# Patient Record
Sex: Female | Born: 1945 | Race: White | Hispanic: No | Marital: Married | Smoking: Never smoker
Health system: Southern US, Community
[De-identification: ages and names within clinical notes are randomized; demographics above are authoritative.]

## PROBLEM LIST (undated history)

## (undated) DIAGNOSIS — Z789 Other specified health status: Secondary | ICD-10-CM

## (undated) DIAGNOSIS — K219 Gastro-esophageal reflux disease without esophagitis: Secondary | ICD-10-CM

## (undated) DIAGNOSIS — M1990 Unspecified osteoarthritis, unspecified site: Secondary | ICD-10-CM

## (undated) DIAGNOSIS — J302 Other seasonal allergic rhinitis: Secondary | ICD-10-CM

---

## 2001-08-01 HISTORY — PX: ORIF ANKLE FRACTURE: SHX5408

## 2001-09-23 ENCOUNTER — Emergency Department (HOSPITAL_COMMUNITY): Admission: EM | Admit: 2001-09-23 | Discharge: 2001-09-23 | Payer: Self-pay | Admitting: Emergency Medicine

## 2001-09-23 ENCOUNTER — Encounter: Payer: Self-pay | Admitting: Emergency Medicine

## 2001-09-24 ENCOUNTER — Ambulatory Visit (HOSPITAL_BASED_OUTPATIENT_CLINIC_OR_DEPARTMENT_OTHER): Admission: RE | Admit: 2001-09-24 | Discharge: 2001-09-25 | Payer: Self-pay | Admitting: Orthopedic Surgery

## 2009-03-18 ENCOUNTER — Ambulatory Visit: Payer: Self-pay | Admitting: Women's Health

## 2009-03-18 ENCOUNTER — Encounter: Payer: Self-pay | Admitting: Obstetrics and Gynecology

## 2009-03-18 ENCOUNTER — Other Ambulatory Visit: Admission: RE | Admit: 2009-03-18 | Discharge: 2009-03-18 | Payer: Self-pay | Admitting: Obstetrics and Gynecology

## 2009-03-20 ENCOUNTER — Ambulatory Visit: Payer: Self-pay | Admitting: Obstetrics and Gynecology

## 2009-04-07 ENCOUNTER — Ambulatory Visit: Payer: Self-pay | Admitting: Obstetrics and Gynecology

## 2010-12-17 NOTE — Op Note (Signed)
[name]. [facility]  Patient:    [name], [name] Visit Number: [id number] MRN: [id number]          Service Type: DSU Location: [facility] Attending Physician:  Alinda Deem Dictated by:   Alinda Deem, M.D. Proc. Date: 09/24/01 Admit Date:  09/24/2001                             Operative Report  PREOPERATIVE DIAGNOSIS:  Right ankle bimalleolar fracture.  POSTOPERATIVE DIAGNOSIS:  Right ankle bimalleolar fracture.  OPERATION PERFORMED:  Open reduction internal fixation of right ankle using Depuy titanium screw set on the medial side, two cannulated lag screws on the lateral side, a six-hole one third tubular plate and two anterior to posterior lag screws.  SURGEON:  Alinda Deem, M.D.  ASSISTANT:  Dorthula Matas, P.A.-C.  ANESTHESIA:  General endotracheal.  ESTIMATED BLOOD LOSS:  Minimal.  FLUID REPLACEMENT:  800 cc of crystalloid.  DRAINS:  None.  TOURNIQUET TIME:  One hour and five minutes.  INDICATIONS FOR PROCEDURE:  The patient is a 65 year old woman who slipped and fell on the stairs at home yesterday which would have been September 23, 2001 and sustained a minimally displaced but bimalleolar fracture of her right ankle.  She was seen at the [facility] Emergency Department and placed in a splint with crutches, seen in the office on the morning of September 24, 2001.  She was n.p.o. and she was taken for surgical intervention today to stabilize her unstable right ankle fracture to decrease pain and increase function.  Risks and benefits of surgery were understood by the patient.  DESCRIPTION OF PROCEDURE:  The patient was identified by arm band and taken to the operating room at [facility] where the appropriate anesthetic monitors were attached and general endotracheal anesthesia induced with the patient in the supine position.  Tourniquet applied high to the right calf. Right lower  extremity was prepped and draped in the usual sterile fashion from the toes to the tourniquet.  Limb wrapped with an Esmarch bandage.  Tourniquet inflated to 300 mHg and we started laterally making a longitudinal incision starting at the tip of the lateral malleolus and going proximally for about 8 to 10 cm.  Small bleeders in the skin and subcutaneous tissue were identified and cauterized.  We dissected down to the fibula fracture in a longitudinal fashion with the dissecting scissors and pick ups being careful to avoid any significant branches of the superficial peroneal nerve or the sternal nerve. We immediately identified the fracture site and cleaned off the edges with a periosteal elevator and then reduced the oblique spiral fracture SE2 pattern with a lions jaw clamp.  We placed to anterior to posterior lag screws and applied a one third tubular 6-hole plate with there proximal bicortical screws and two distal cancellous screws obtaining good firm fixation. The syndesmosis appeared to be intact.  We then directed our attention to the medial side where a 4 to 5 cm incision longitudinal over the medial malleolus was made from about 1 to 2 cm distal to the tip and then proximal for about 3 to 4 cm to the fracture site.  Small bleeders in the skin and subcutaneous tissues were identified and cauterized.  The medial malleolus fracture was a large piece oblique and we carefully cleaned the edges once again with the elevator and the scissors.  Using the  hook we inspected the joint carefully and found no cartilage bits in the joint.  We then reduced the medial malleus fragment with a towel clip and fixed it with two 4 mm one third threaded cancellous lag screws going from proximal to distal.  C-arm images were taken confirming the anatomic reduction of the fracture which could no longer be seen on the x-rays because of the reduction.  At this point the tourniquet was let down.  Small bleeders  once again identified and cauterized.  The wounds were washed out with normal saline solution.  The subcutaneous tissues were closed with running 2-0 Vicryl suture.  The skin with running interlocking 3-0 nylon and dressing of Xeroform, 4 x 4 dressing sponges, Webril and an Ace wrap and a Cam walker boot applied.  The patient was awakened and taken to the recovery room without difficulty. Dictated by:   Alinda Deem, M.D. Attending Physician:  Alinda Deem DD:  09/24/01 TD:  09/24/01 Job: [id number]

## 2014-07-08 DIAGNOSIS — Z23 Encounter for immunization: Secondary | ICD-10-CM | POA: Diagnosis not present

## 2014-08-18 DIAGNOSIS — M5442 Lumbago with sciatica, left side: Secondary | ICD-10-CM | POA: Diagnosis not present

## 2014-08-19 DIAGNOSIS — M5442 Lumbago with sciatica, left side: Secondary | ICD-10-CM | POA: Diagnosis not present

## 2014-09-05 DIAGNOSIS — M5442 Lumbago with sciatica, left side: Secondary | ICD-10-CM | POA: Diagnosis not present

## 2014-09-05 DIAGNOSIS — M5136 Other intervertebral disc degeneration, lumbar region: Secondary | ICD-10-CM | POA: Diagnosis not present

## 2014-09-16 DIAGNOSIS — M5442 Lumbago with sciatica, left side: Secondary | ICD-10-CM | POA: Diagnosis not present

## 2014-09-18 DIAGNOSIS — M5442 Lumbago with sciatica, left side: Secondary | ICD-10-CM | POA: Diagnosis not present

## 2014-09-22 DIAGNOSIS — M5442 Lumbago with sciatica, left side: Secondary | ICD-10-CM | POA: Diagnosis not present

## 2014-09-26 DIAGNOSIS — M5442 Lumbago with sciatica, left side: Secondary | ICD-10-CM | POA: Diagnosis not present

## 2014-09-29 DIAGNOSIS — M5442 Lumbago with sciatica, left side: Secondary | ICD-10-CM | POA: Diagnosis not present

## 2014-10-02 DIAGNOSIS — M5442 Lumbago with sciatica, left side: Secondary | ICD-10-CM | POA: Diagnosis not present

## 2014-10-06 DIAGNOSIS — M5442 Lumbago with sciatica, left side: Secondary | ICD-10-CM | POA: Diagnosis not present

## 2014-10-17 DIAGNOSIS — M5136 Other intervertebral disc degeneration, lumbar region: Secondary | ICD-10-CM | POA: Diagnosis not present

## 2014-10-17 DIAGNOSIS — M5442 Lumbago with sciatica, left side: Secondary | ICD-10-CM | POA: Diagnosis not present

## 2015-05-08 DIAGNOSIS — M5136 Other intervertebral disc degeneration, lumbar region: Secondary | ICD-10-CM | POA: Diagnosis not present

## 2015-05-08 DIAGNOSIS — M5442 Lumbago with sciatica, left side: Secondary | ICD-10-CM | POA: Diagnosis not present

## 2015-05-13 ENCOUNTER — Telehealth: Payer: Self-pay

## 2015-05-13 NOTE — Telephone Encounter (Signed)
Daughter advised.

## 2015-05-13 NOTE — Telephone Encounter (Signed)
She needs to have primary care okay her for surgery, this information can be conveyed to the daughter since she did call. Tell her we wish her luck with her surgery.

## 2015-05-13 NOTE — Telephone Encounter (Signed)
Daughter called stating mom to have some orthopedic back surgery and there are some questions her MD has.  Upon calling her back she said Orthopedic MD wants her to have exam to have someone listen to heart and lungs and check her out and make sure okay for surgery. Daughter was talking about scheduling appt with you.  Usually PCP is the one who gives medical clearance for surgery.  Would you not prefer she see PCP?   (We do not have DPR acess to speak with daughter about mom.)

## 2015-05-16 DIAGNOSIS — M545 Low back pain: Secondary | ICD-10-CM | POA: Diagnosis not present

## 2015-05-18 DIAGNOSIS — Z23 Encounter for immunization: Secondary | ICD-10-CM | POA: Diagnosis not present

## 2015-05-22 DIAGNOSIS — S32010A Wedge compression fracture of first lumbar vertebra, initial encounter for closed fracture: Secondary | ICD-10-CM | POA: Diagnosis not present

## 2015-05-22 DIAGNOSIS — M5136 Other intervertebral disc degeneration, lumbar region: Secondary | ICD-10-CM | POA: Diagnosis not present

## 2015-05-22 DIAGNOSIS — M5442 Lumbago with sciatica, left side: Secondary | ICD-10-CM | POA: Diagnosis not present

## 2015-06-02 ENCOUNTER — Other Ambulatory Visit (INDEPENDENT_AMBULATORY_CARE_PROVIDER_SITE_OTHER): Payer: BLUE CROSS/BLUE SHIELD

## 2015-06-02 ENCOUNTER — Encounter: Payer: Self-pay | Admitting: Internal Medicine

## 2015-06-02 ENCOUNTER — Ambulatory Visit (INDEPENDENT_AMBULATORY_CARE_PROVIDER_SITE_OTHER): Payer: BLUE CROSS/BLUE SHIELD | Admitting: Internal Medicine

## 2015-06-02 VITALS — BP 124/82 | HR 57 | Temp 98.3°F | Resp 16 | Ht 67.5 in | Wt 211.0 lb

## 2015-06-02 DIAGNOSIS — J302 Other seasonal allergic rhinitis: Secondary | ICD-10-CM

## 2015-06-02 DIAGNOSIS — Z01818 Encounter for other preprocedural examination: Secondary | ICD-10-CM

## 2015-06-02 DIAGNOSIS — E669 Obesity, unspecified: Secondary | ICD-10-CM

## 2015-06-02 DIAGNOSIS — J309 Allergic rhinitis, unspecified: Secondary | ICD-10-CM | POA: Insufficient documentation

## 2015-06-02 DIAGNOSIS — Z23 Encounter for immunization: Secondary | ICD-10-CM | POA: Diagnosis not present

## 2015-06-02 DIAGNOSIS — M5416 Radiculopathy, lumbar region: Secondary | ICD-10-CM | POA: Insufficient documentation

## 2015-06-02 LAB — COMPREHENSIVE METABOLIC PANEL
ALT: 16 U/L (ref 0–35)
AST: 26 U/L (ref 0–37)
Albumin: 3.9 g/dL (ref 3.5–5.2)
Alkaline Phosphatase: 88 U/L (ref 39–117)
BUN: 13 mg/dL (ref 6–23)
CO2: 30 mEq/L (ref 19–32)
Calcium: 9.6 mg/dL (ref 8.4–10.5)
Chloride: 106 mEq/L (ref 96–112)
Creatinine, Ser: 0.78 mg/dL (ref 0.40–1.20)
GFR: 78.01 mL/min (ref >60.00)
Glucose, Bld: 104 mg/dL — ABNORMAL HIGH (ref 70–99)
Potassium: 4.5 mEq/L (ref 3.5–5.1)
Sodium: 141 mEq/L (ref 135–145)
Total Bilirubin: 0.4 mg/dL (ref 0.2–1.2)
Total Protein: 7.8 g/dL (ref 6.0–8.3)

## 2015-06-02 LAB — CBC WITH DIFFERENTIAL/PLATELET
Basophils Absolute: 0 10*3/uL (ref 0.0–0.1)
Basophils Relative: 0.5 % (ref 0.0–3.0)
Eosinophils Absolute: 0.1 10*3/uL (ref 0.0–0.7)
Eosinophils Relative: 0.7 % (ref 0.0–5.0)
HCT: 42.4 % (ref 36.0–46.0)
Hemoglobin: 14 g/dL (ref 12.0–15.0)
Lymphocytes Relative: 37.4 % (ref 12.0–46.0)
Lymphs Abs: 2.7 10*3/uL (ref 0.7–4.0)
MCHC: 33 g/dL (ref 30.0–36.0)
MCV: 87.1 fl (ref 78.0–100.0)
Monocytes Absolute: 0.4 10*3/uL (ref 0.1–1.0)
Monocytes Relative: 5.8 % (ref 3.0–12.0)
Neutro Abs: 4 10*3/uL (ref 1.4–7.7)
Neutrophils Relative %: 55.6 % (ref 43.0–77.0)
Platelets: 198 10*3/uL (ref 150.0–400.0)
RBC: 4.87 Mil/uL (ref 3.87–5.11)
RDW: 15.2 % (ref 11.5–15.5)
WBC: 7.3 10*3/uL (ref 4.0–10.5)

## 2015-06-02 LAB — TSH: TSH: 1.21 u[IU]/mL (ref 0.35–4.50)

## 2015-06-02 LAB — HEMOGLOBIN A1C: HEMOGLOBIN A1C: 5.5 % (ref 4.6–6.5)

## 2015-06-02 NOTE — Progress Notes (Signed)
Subjective:    Patient ID: [name], female    DOB: 12/30/1945, 69 y.o.   MRN: [id number]  HPI She is here to establish with a new pcp. She needs clearance of surgery.  [facility] will be doing kyphoplasty on L1 on 07/02/15.  He has requested medical clearance.  She has not had a primary care physician for years.    Chronic lower back pain with radiculopathy:  She has a deteriorated discs in her lower back with arthritic changes that is causing radiating nerve pain and numbness down her left leg.  Her toes are numb on the left foot.  Her symptoms started a couple of years ago and have been getting worse. She has some weakness in her left leg.  Her symptoms limit how much she is able to walk.       Thoracic compression fracture:  She was found to have a compression fracture in her thoracic region.  She believes it was secondary to a fall last June.  The only other fracture she has had was her right ankle in 2003, which was from a fall.     She denies a personal or family history of problems with anesthesia.  She denies a family and personal history of bleeding or blood clot problems.   She is limited in her exercise, but denies any exertion chest pain, sob, palpitations or lightheadedness.   Medications and allergies reviewed with patient and updated if appropriate.  Patient Active Problem List   Diagnosis Date Noted  . Allergic rhinitis 06/02/2015    History reviewed. No pertinent past medical history.  Past Surgical History  Procedure Laterality Date  . Orif ankle fracture Right 2003    Social History   Social History  . Marital Status: Married    Spouse Name: N/A  . Number of Children: N/A  . Years of Education: N/A   Social History Main Topics  . Smoking status: Never Smoker   . Smokeless tobacco: Never Used  . Alcohol Use: No  . Drug Use: No  . Sexual Activity: Not Asked   Other Topics Concern  . None   Social History Narrative    . None    Review of Systems  Constitutional: Negative for fever, chills, appetite change, fatigue and unexpected weight change.  HENT: Positive for rhinorrhea. Negative for congestion, ear pain, hearing loss and sore throat.   Eyes: Negative for visual disturbance.  Respiratory: Positive for cough (intermittent with allergies). Negative for shortness of breath and wheezing.   Cardiovascular: Negative.   Gastrointestinal: Negative for nausea, abdominal pain, diarrhea, constipation and blood in stool.       GERD occasion with certain foods  Genitourinary: Negative for dysuria, frequency and hematuria.  Musculoskeletal: Positive for back pain and arthralgias (arthritis in hands).  Neurological: Positive for weakness (left leg) and numbness (numbness in left leg). Negative for dizziness, light-headedness and headaches.  Psychiatric/Behavioral: Negative for dysphoric mood. The patient is not nervous/anxious.        Objective:   Filed Vitals:   06/02/15 1506  BP: 124/82  Pulse: 57  Temp: 98.3 F (36.8 C)  Resp: 16   Filed Weights   06/02/15 1506  Weight: 211 lb (95.709 kg)   Body mass index is 32.54 kg/(m^2).   Physical Exam  Constitutional: She is oriented to person, place, and time. She appears well-developed and well-nourished. No distress.  HENT:  Head: Normocephalic and atraumatic.  Right Ear:  External ear normal.  Left Ear: External ear normal.  Mouth/Throat: Oropharynx is clear and moist.  Eyes: Conjunctivae and EOM are normal.  Neck: Neck supple. No JVD present. No tracheal deviation present. No thyromegaly present.  No carotid bruit  Cardiovascular: Normal rate, regular rhythm, normal heart sounds and intact distal pulses.   No murmur heard. Pulmonary/Chest: Effort normal and breath sounds normal. No respiratory distress. She has no wheezes.  Abdominal: Soft. She exhibits no distension. There is no tenderness.  Musculoskeletal: She exhibits no edema.   Lymphadenopathy:    She has no cervical adenopathy.  Neurological: She is alert and oriented to person, place, and time.  Skin: Skin is warm and dry. No rash noted.  Psychiatric: She has a normal mood and affect. Her behavior is normal.        Assessment & Plan:    Pre-operative clearance for 12/1 L1 kyphoplasty Since she has not had blood work done in several years or an EKG we will go ahead and do basic blood work and EKG today Asymptomatic cardiac pulmonary standpoint personal or family history of anesthesia difficulties or bleeding/blood clot problems Clearance for surgery and we will send a letter to orthopedic - Dr. Shon Baton.  Advised annual follow up for a PE

## 2015-06-02 NOTE — Progress Notes (Signed)
Pre visit review using our clinic review tool, if applicable. No additional management support is needed unless otherwise documented below in the visit note. 

## 2015-06-02 NOTE — Patient Instructions (Signed)
  You received a prevnar pneumonia vaccine and had an EKG today.  Test(s) ordered today. Your results will be released to MyChart (or called to you) after review, usually within 72hours after test completion. If any changes need to be made, you will be notified at that same time.  All other Health Maintenance issues reviewed.   I recommend an annual physical.   Medications reviewed and updated.  No changes recommended at this time.  We will send the clearance letter to your surgeon.

## 2015-06-04 ENCOUNTER — Telehealth: Payer: Self-pay | Admitting: Emergency Medicine

## 2015-06-04 NOTE — Telephone Encounter (Signed)
Pre op clearance has been sent to [facility].

## 2015-07-06 ENCOUNTER — Encounter (HOSPITAL_COMMUNITY): Payer: Self-pay

## 2015-07-06 ENCOUNTER — Encounter (HOSPITAL_COMMUNITY)
Admission: RE | Admit: 2015-07-06 | Discharge: 2015-07-06 | Disposition: A | Discharge status: Home / Self Care | Payer: BLUE CROSS/BLUE SHIELD | Source: Ambulatory Visit | Attending: Orthopedic Surgery | Admitting: Orthopedic Surgery

## 2015-07-06 DIAGNOSIS — M8008XA Age-related osteoporosis with current pathological fracture, vertebra(e), initial encounter for fracture: Principal | ICD-10-CM | POA: Diagnosis present

## 2015-07-06 DIAGNOSIS — M4807 Spinal stenosis, lumbosacral region: Secondary | ICD-10-CM | POA: Diagnosis present

## 2015-07-06 DIAGNOSIS — K219 Gastro-esophageal reflux disease without esophagitis: Secondary | ICD-10-CM | POA: Diagnosis present

## 2015-07-06 HISTORY — DX: Other specified health status: Z78.9

## 2015-07-06 HISTORY — DX: Gastro-esophageal reflux disease without esophagitis: K21.9

## 2015-07-06 HISTORY — DX: Unspecified osteoarthritis, unspecified site: M19.90

## 2015-07-06 HISTORY — DX: Other seasonal allergic rhinitis: J30.2

## 2015-07-06 LAB — TYPE AND SCREEN
ABO/RH(D): A POS
Antibody Screen: NEGATIVE

## 2015-07-06 LAB — CBC
HEMATOCRIT: 41.3 % (ref 36.0–46.0)
HEMOGLOBIN: 13.9 g/dL (ref 12.0–15.0)
MCH: 29.1 pg (ref 26.0–34.0)
MCHC: 33.7 g/dL (ref 30.0–36.0)
MCV: 86.6 fL (ref 78.0–100.0)
Platelets: 179 10*3/uL (ref 150–400)
RBC: 4.77 MIL/uL (ref 3.87–5.11)
RDW: 15 % (ref 11.5–15.5)
WBC: 6.2 10*3/uL (ref 4.0–10.5)

## 2015-07-06 LAB — SURGICAL PCR SCREEN
MRSA, PCR: NEGATIVE
STAPHYLOCOCCUS AUREUS: POSITIVE — AB

## 2015-07-06 LAB — ABO/RH: ABO/RH(D): A POS

## 2015-07-06 NOTE — Pre-Procedure Instructions (Signed)
    Janit Paganeggy J Traeger  07/06/2015     Your procedure is scheduled on Wednesday, December 7.  Report to [facility] Admitting at 11:00 A.M.              Your surgery or procedure is scheduled for 1: 00 P.M.   Call this number if you have problems the morning of surgery:[phone]   Remember:  Do not eat food or drink liquids after midnight Tuesday, December 6.  Take these medicines the morning of surgery with A SIP OF WATER None.                                                                                                                                                                                                                                                                                                                                            Stop taking Bioflex and Ibuprofen,.   Do not wear jewelry, make-up or nail polish.   Do not wear lotions, powders, or perfumes.   Do not shave 48 hours prior to surgery.    Do not bring valuables to the hospital.   [facility] is not responsible for any belongings or valuables.  Contacts, dentures or bridgework may not be worn into surgery.  Leave your suitcase in the car.  After surgery it may be brought to your room.  For patients admitted to the hospital, discharge time will be determined by your treatment team.  Patients discharged the day of surgery will not be allowed to drive home.   Name and phone number of your driver: -  Special instructions:  Review  [place] - Preparing For Surgery Please read over the following fact sheets that you were given.  Pain Booklet, Coughing and Deep Breathing, Blood Transfusion Information and Surgical Site Infection Prevention

## 2015-07-07 MED ORDER — CEFAZOLIN SODIUM-DEXTROSE 2-3 GM-% IV SOLR
2.0000 g | INTRAVENOUS | Status: AC
  Administered 2015-07-08 (×2): 2 g via INTRAVENOUS
  Filled 2015-07-07: qty 50

## 2015-07-07 NOTE — Anesthesia Preprocedure Evaluation (Addendum)
Anesthesia Evaluation  Patient identified by MRN, date of birth, ID band Patient awake    Reviewed: Allergy & Precautions, NPO status , Patient's Chart, lab work & pertinent test results  Airway Mallampati: II   Neck ROM: Full    Dental  (+) Edentulous Upper, Edentulous Lower   Pulmonary neg pulmonary ROS,    breath sounds clear to auscultation       Cardiovascular negative cardio ROS   Rhythm:Regular  EKG 06/2015 WNL, has had surgery clearance by PCP   Neuro/Psych    GI/Hepatic Neg liver ROS, GERD  Medicated,  Endo/Other  negative endocrine ROS  Renal/GU negative Renal ROS     Musculoskeletal  (+) Arthritis ,   Abdominal (+)  Abdomen: soft.    Peds  Hematology 14/41   Anesthesia Other Findings   Reproductive/Obstetrics                           Anesthesia Physical Anesthesia Plan  ASA: II  Anesthesia Plan: General   Post-op Pain Management:    Induction: Intravenous  Airway Management Planned: Oral ETT  Additional Equipment:   Intra-op Plan:   Post-operative Plan: Extubation in OR  Informed Consent: I have reviewed the patients History and Physical, chart, labs and discussed the procedure including the risks, benefits and alternatives for the proposed anesthesia with the patient or authorized representative who has indicated his/her understanding and acceptance.     Plan Discussed with:   Anesthesia Plan Comments: (2nd IV after turning)       Anesthesia Quick Evaluation

## 2015-07-08 ENCOUNTER — Inpatient Hospital Stay (HOSPITAL_COMMUNITY): Payer: BLUE CROSS/BLUE SHIELD | Admitting: Anesthesiology

## 2015-07-08 ENCOUNTER — Inpatient Hospital Stay (HOSPITAL_COMMUNITY)
Admission: RE | Admit: 2015-07-08 | Discharge: 2015-07-10 | DRG: 458 | Disposition: A | Discharge status: Home / Self Care | Payer: BLUE CROSS/BLUE SHIELD | Source: Ambulatory Visit | Attending: Orthopedic Surgery | Admitting: Orthopedic Surgery

## 2015-07-08 ENCOUNTER — Inpatient Hospital Stay (HOSPITAL_COMMUNITY): Payer: BLUE CROSS/BLUE SHIELD

## 2015-07-08 ENCOUNTER — Encounter (HOSPITAL_COMMUNITY)
Admission: RE | Disposition: A | Discharge status: Home / Self Care | Payer: BLUE CROSS/BLUE SHIELD | Source: Ambulatory Visit | Attending: Orthopedic Surgery

## 2015-07-08 ENCOUNTER — Encounter (HOSPITAL_COMMUNITY): Payer: Self-pay | Admitting: *Deleted

## 2015-07-08 DIAGNOSIS — S32010A Wedge compression fracture of first lumbar vertebra, initial encounter for closed fracture: Secondary | ICD-10-CM | POA: Diagnosis not present

## 2015-07-08 DIAGNOSIS — M5136 Other intervertebral disc degeneration, lumbar region: Secondary | ICD-10-CM | POA: Diagnosis not present

## 2015-07-08 DIAGNOSIS — M549 Dorsalgia, unspecified: Secondary | ICD-10-CM | POA: Diagnosis not present

## 2015-07-08 DIAGNOSIS — M4806 Spinal stenosis, lumbar region: Secondary | ICD-10-CM | POA: Diagnosis not present

## 2015-07-08 DIAGNOSIS — M5416 Radiculopathy, lumbar region: Secondary | ICD-10-CM | POA: Diagnosis not present

## 2015-07-08 DIAGNOSIS — Z419 Encounter for procedure for purposes other than remedying health state, unspecified: Secondary | ICD-10-CM

## 2015-07-08 DIAGNOSIS — Z9889 Other specified postprocedural states: Secondary | ICD-10-CM

## 2015-07-08 DIAGNOSIS — K219 Gastro-esophageal reflux disease without esophagitis: Secondary | ICD-10-CM | POA: Diagnosis not present

## 2015-07-08 DIAGNOSIS — M4807 Spinal stenosis, lumbosacral region: Secondary | ICD-10-CM | POA: Diagnosis present

## 2015-07-08 DIAGNOSIS — M8008XA Age-related osteoporosis with current pathological fracture, vertebra(e), initial encounter for fracture: Secondary | ICD-10-CM | POA: Diagnosis present

## 2015-07-08 DIAGNOSIS — G8929 Other chronic pain: Secondary | ICD-10-CM | POA: Diagnosis present

## 2015-07-08 HISTORY — PX: KYPHOPLASTY: SHX5884

## 2015-07-08 HISTORY — PX: TRANSFORAMINAL LUMBAR INTERBODY FUSION (TLIF) WITH PEDICLE SCREW FIXATION 1 LEVEL: SHX6141

## 2015-07-08 SURGERY — KYPHOPLASTY
Anesthesia: General | Site: Back

## 2015-07-08 MED ORDER — PROPOFOL 10 MG/ML IV BOLUS
INTRAVENOUS | Status: DC | PRN
  Administered 2015-07-08: 150 mg via INTRAVENOUS

## 2015-07-08 MED ORDER — METHOCARBAMOL 500 MG PO TABS
500.0000 mg | ORAL_TABLET | Freq: Four times a day (QID) | ORAL | Status: DC | PRN
  Administered 2015-07-08 – 2015-07-10 (×5): 500 mg via ORAL
  Filled 2015-07-08 (×5): qty 1

## 2015-07-08 MED ORDER — BUPIVACAINE-EPINEPHRINE (PF) 0.25% -1:200000 IJ SOLN
INTRAMUSCULAR | Status: AC
  Filled 2015-07-08: qty 30

## 2015-07-08 MED ORDER — PROPOFOL 10 MG/ML IV BOLUS
INTRAVENOUS | Status: AC
  Filled 2015-07-08: qty 20

## 2015-07-08 MED ORDER — CEFAZOLIN SODIUM-DEXTROSE 2-3 GM-% IV SOLR
INTRAVENOUS | Status: AC
  Filled 2015-07-08: qty 50

## 2015-07-08 MED ORDER — MENTHOL 3 MG MT LOZG: 1.0000 | LOZENGE | OROMUCOSAL | Status: DC | PRN

## 2015-07-08 MED ORDER — DEXAMETHASONE SODIUM PHOSPHATE 4 MG/ML IJ SOLN
4.0000 mg | Freq: Four times a day (QID) | INTRAMUSCULAR | Status: AC
  Administered 2015-07-08 – 2015-07-09 (×3): 4 mg via INTRAVENOUS
  Filled 2015-07-08 (×3): qty 1

## 2015-07-08 MED ORDER — OXYCODONE-ACETAMINOPHEN 10-325 MG PO TABS: 1.0000 | ORAL_TABLET | ORAL | Status: DC | PRN

## 2015-07-08 MED ORDER — LIDOCAINE HCL (CARDIAC) 20 MG/ML IV SOLN
INTRAVENOUS | Status: AC
  Filled 2015-07-08: qty 5

## 2015-07-08 MED ORDER — ONDANSETRON HCL 4 MG/2ML IJ SOLN
INTRAMUSCULAR | Status: AC
  Filled 2015-07-08: qty 2

## 2015-07-08 MED ORDER — EPHEDRINE SULFATE 50 MG/ML IJ SOLN
INTRAMUSCULAR | Status: AC
  Filled 2015-07-08: qty 1

## 2015-07-08 MED ORDER — THROMBIN 20000 UNITS EX SOLR
CUTANEOUS | Status: AC
  Filled 2015-07-08: qty 20000

## 2015-07-08 MED ORDER — HYDROMORPHONE HCL 1 MG/ML IJ SOLN
INTRAMUSCULAR | Status: AC
  Administered 2015-07-08: 0.5 mg via INTRAVENOUS
  Filled 2015-07-08: qty 1

## 2015-07-08 MED ORDER — PROPOFOL 500 MG/50ML IV EMUL
INTRAVENOUS | Status: DC | PRN
  Administered 2015-07-08: 50 ug/kg/min via INTRAVENOUS

## 2015-07-08 MED ORDER — BUPIVACAINE-EPINEPHRINE 0.25% -1:200000 IJ SOLN
INTRAMUSCULAR | Status: DC | PRN
  Administered 2015-07-08: 20 mL

## 2015-07-08 MED ORDER — FENTANYL CITRATE (PF) 250 MCG/5ML IJ SOLN
INTRAMUSCULAR | Status: DC | PRN
  Administered 2015-07-08: 25 ug via INTRAVENOUS
  Administered 2015-07-08: 100 ug via INTRAVENOUS
  Administered 2015-07-08: 50 ug via INTRAVENOUS
  Administered 2015-07-08: 100 ug via INTRAVENOUS
  Administered 2015-07-08: 150 ug via INTRAVENOUS

## 2015-07-08 MED ORDER — MIDAZOLAM HCL 2 MG/2ML IJ SOLN
INTRAMUSCULAR | Status: AC
  Filled 2015-07-08: qty 2

## 2015-07-08 MED ORDER — LIDOCAINE HCL (CARDIAC) 20 MG/ML IV SOLN
INTRAVENOUS | Status: DC | PRN
  Administered 2015-07-08: 100 mg via INTRAVENOUS

## 2015-07-08 MED ORDER — FENTANYL CITRATE (PF) 250 MCG/5ML IJ SOLN
INTRAMUSCULAR | Status: AC
  Filled 2015-07-08: qty 5

## 2015-07-08 MED ORDER — SUCCINYLCHOLINE CHLORIDE 20 MG/ML IJ SOLN
INTRAMUSCULAR | Status: DC | PRN
  Administered 2015-07-08: 100 mg via INTRAVENOUS

## 2015-07-08 MED ORDER — LACTATED RINGERS IV SOLN
INTRAVENOUS | Status: DC
  Administered 2015-07-08: 20:00:00 via INTRAVENOUS

## 2015-07-08 MED ORDER — SODIUM CHLORIDE 0.9 % IJ SOLN: 3.0000 mL | INTRAMUSCULAR | Status: DC | PRN

## 2015-07-08 MED ORDER — ACETAMINOPHEN 10 MG/ML IV SOLN
INTRAVENOUS | Status: DC | PRN
  Administered 2015-07-08: 1000 mg via INTRAVENOUS

## 2015-07-08 MED ORDER — SUCCINYLCHOLINE CHLORIDE 20 MG/ML IJ SOLN
INTRAMUSCULAR | Status: AC
  Filled 2015-07-08: qty 1

## 2015-07-08 MED ORDER — SODIUM CHLORIDE 0.9 % IV SOLN
250.0000 mL | INTRAVENOUS | Status: DC
  Administered 2015-07-08: 250 mL via INTRAVENOUS

## 2015-07-08 MED ORDER — EPHEDRINE SULFATE 50 MG/ML IJ SOLN
INTRAMUSCULAR | Status: DC | PRN
  Administered 2015-07-08: 10 mg via INTRAVENOUS

## 2015-07-08 MED ORDER — 0.9 % SODIUM CHLORIDE (POUR BTL) OPTIME
TOPICAL | Status: DC | PRN
  Administered 2015-07-08: 1000 mL

## 2015-07-08 MED ORDER — THROMBIN 20000 UNITS EX KIT
PACK | CUTANEOUS | Status: DC | PRN
  Administered 2015-07-08: 20 mL via TOPICAL

## 2015-07-08 MED ORDER — GLYCOPYRROLATE 0.2 MG/ML IJ SOLN
INTRAMUSCULAR | Status: AC
  Filled 2015-07-08: qty 1

## 2015-07-08 MED ORDER — SODIUM CHLORIDE 0.9 % IJ SOLN
3.0000 mL | Freq: Two times a day (BID) | INTRAMUSCULAR | Status: DC
  Administered 2015-07-09 – 2015-07-10 (×2): 3 mL via INTRAVENOUS

## 2015-07-08 MED ORDER — MEPERIDINE HCL 25 MG/ML IJ SOLN: 6.2500 mg | INTRAMUSCULAR | Status: DC | PRN

## 2015-07-08 MED ORDER — PHENYLEPHRINE 40 MCG/ML (10ML) SYRINGE FOR IV PUSH (FOR BLOOD PRESSURE SUPPORT)
PREFILLED_SYRINGE | INTRAVENOUS | Status: AC
  Filled 2015-07-08: qty 10

## 2015-07-08 MED ORDER — MORPHINE SULFATE (PF) 2 MG/ML IV SOLN: 1.0000 mg | INTRAVENOUS | Status: DC | PRN

## 2015-07-08 MED ORDER — METHOCARBAMOL 1000 MG/10ML IJ SOLN: 500.0000 mg | Freq: Four times a day (QID) | INTRAVENOUS | Status: DC | PRN

## 2015-07-08 MED ORDER — LACTATED RINGERS IV SOLN
Freq: Once | INTRAVENOUS | Status: AC
  Administered 2015-07-08: 12:00:00 via INTRAVENOUS

## 2015-07-08 MED ORDER — ONDANSETRON HCL 4 MG/2ML IJ SOLN
INTRAMUSCULAR | Status: DC | PRN
Start: 2015-07-08 — End: 2015-07-08
  Administered 2015-07-08 (×2): 4 mg via INTRAVENOUS

## 2015-07-08 MED ORDER — PHENOL 1.4 % MT LIQD: 1.0000 | OROMUCOSAL | Status: DC | PRN

## 2015-07-08 MED ORDER — DEXAMETHASONE 4 MG PO TABS: 4.0000 mg | ORAL_TABLET | Freq: Four times a day (QID) | ORAL | Status: AC

## 2015-07-08 MED ORDER — OXYCODONE HCL 5 MG PO TABS
10.0000 mg | ORAL_TABLET | ORAL | Status: DC | PRN
  Administered 2015-07-08 – 2015-07-10 (×7): 10 mg via ORAL
  Filled 2015-07-08 (×7): qty 2

## 2015-07-08 MED ORDER — PHENYLEPHRINE HCL 10 MG/ML IJ SOLN
INTRAMUSCULAR | Status: DC | PRN
  Administered 2015-07-08: 120 ug via INTRAVENOUS

## 2015-07-08 MED ORDER — PROMETHAZINE HCL 25 MG/ML IJ SOLN: 6.2500 mg | INTRAMUSCULAR | Status: DC | PRN

## 2015-07-08 MED ORDER — DEXAMETHASONE SODIUM PHOSPHATE 4 MG/ML IJ SOLN
INTRAMUSCULAR | Status: DC | PRN
  Administered 2015-07-08: 8 mg via INTRAVENOUS

## 2015-07-08 MED ORDER — METHOCARBAMOL 500 MG PO TABS: 500.0000 mg | ORAL_TABLET | Freq: Three times a day (TID) | ORAL | Status: DC | PRN

## 2015-07-08 MED ORDER — ACETAMINOPHEN 10 MG/ML IV SOLN
INTRAVENOUS | Status: AC
  Filled 2015-07-08: qty 100

## 2015-07-08 MED ORDER — ONDANSETRON HCL 4 MG/2ML IJ SOLN: 4.0000 mg | INTRAMUSCULAR | Status: DC | PRN

## 2015-07-08 MED ORDER — PHENYLEPHRINE HCL 10 MG/ML IJ SOLN
10.0000 mg | INTRAMUSCULAR | Status: DC | PRN
  Administered 2015-07-08: 25 ug/min via INTRAVENOUS

## 2015-07-08 MED ORDER — FLEET ENEMA 7-19 GM/118ML RE ENEM
1.0000 | ENEMA | Freq: Once | RECTAL | Status: AC
  Administered 2015-07-08: 1 via RECTAL
  Filled 2015-07-08: qty 1

## 2015-07-08 MED ORDER — MAGNESIUM CITRATE PO SOLN
0.5000 | Freq: Once | ORAL | Status: AC
  Administered 2015-07-08: 0.5 via ORAL

## 2015-07-08 MED ORDER — HEMOSTATIC AGENTS (NO CHARGE) OPTIME
TOPICAL | Status: DC | PRN
  Administered 2015-07-08: 1 via TOPICAL

## 2015-07-08 MED ORDER — GLYCOPYRROLATE 0.2 MG/ML IJ SOLN
INTRAMUSCULAR | Status: DC | PRN
  Administered 2015-07-08 (×2): 0.2 mg via INTRAVENOUS

## 2015-07-08 MED ORDER — ONDANSETRON HCL 4 MG PO TABS: 4.0000 mg | ORAL_TABLET | Freq: Three times a day (TID) | ORAL | Status: DC | PRN

## 2015-07-08 MED ORDER — CEFAZOLIN SODIUM 1-5 GM-% IV SOLN
1.0000 g | Freq: Three times a day (TID) | INTRAVENOUS | Status: AC
  Administered 2015-07-09 (×2): 1 g via INTRAVENOUS
  Filled 2015-07-08 (×2): qty 50

## 2015-07-08 MED ORDER — MIDAZOLAM HCL 2 MG/2ML IJ SOLN
INTRAMUSCULAR | Status: DC | PRN
  Administered 2015-07-08: 2 mg via INTRAVENOUS

## 2015-07-08 MED ORDER — LACTATED RINGERS IV SOLN
INTRAVENOUS | Status: DC | PRN
  Administered 2015-07-08 (×4): via INTRAVENOUS

## 2015-07-08 MED ORDER — IOHEXOL 300 MG/ML  SOLN
INTRAMUSCULAR | Status: DC | PRN
  Administered 2015-07-08: 10 mL

## 2015-07-08 MED ORDER — HYDROMORPHONE HCL 1 MG/ML IJ SOLN
0.2500 mg | INTRAMUSCULAR | Status: DC | PRN
  Administered 2015-07-08 (×4): 0.5 mg via INTRAVENOUS

## 2015-07-08 MED ORDER — ARTIFICIAL TEARS OP OINT
TOPICAL_OINTMENT | OPHTHALMIC | Status: AC
  Filled 2015-07-08: qty 3.5

## 2015-07-08 MED ORDER — DEXAMETHASONE SODIUM PHOSPHATE 4 MG/ML IJ SOLN
INTRAMUSCULAR | Status: AC
  Filled 2015-07-08: qty 2

## 2015-07-08 SURGICAL SUPPLY — 68 items
BANDAGE ADH SHEER 1  50/CT (GAUZE/BANDAGES/DRESSINGS) IMPLANT
BLADE SURG 15 STRL LF DISP TIS (BLADE) ×1 IMPLANT
BLADE SURG 15 STRL SS (BLADE) ×2
BONE CEMENT SUPPLIES ×3 IMPLANT
BONE FILLER DEVICE STRL SZ3 (INSTRUMENTS) ×3 IMPLANT
CAGE TLIF XLRG 9 LUMBAR (Cage) ×2 IMPLANT
CAGE TLIF XLRG 9MM LUMBAR (Cage) ×1 IMPLANT
CEMENT BONE KYPHX HV R (Orthopedic Implant) ×3 IMPLANT
CEMENT KYPHON C01A KIT/MIXER (Cement) ×3 IMPLANT
CLIP NEUROVISION LG (CLIP) ×3 IMPLANT
CLOSURE STERI-STRIP 1/2X4 (GAUZE/BANDAGES/DRESSINGS) ×1
CLSR STERI-STRIP ANTIMIC 1/2X4 (GAUZE/BANDAGES/DRESSINGS) ×2 IMPLANT
COVER MAYO STAND STRL (DRAPES) ×3 IMPLANT
CURETTE WEDGE 8.5MM KYPHX (MISCELLANEOUS) IMPLANT
DRAPE C-ARM 42X72 X-RAY (DRAPES) ×6 IMPLANT
DRAPE INCISE IOBAN 66X45 STRL (DRAPES) ×3 IMPLANT
DRAPE LAPAROTOMY T 102X78X121 (DRAPES) ×3 IMPLANT
DRAPE PROXIMA HALF (DRAPES) ×6 IMPLANT
DRSG MEPILEX BORDER 4X8 (GAUZE/BANDAGES/DRESSINGS) ×3 IMPLANT
DURAPREP 26ML APPLICATOR (WOUND CARE) ×3 IMPLANT
ELECT PENCIL ROCKER SW 15FT (MISCELLANEOUS) ×3 IMPLANT
GAUZE SPONGE 4X4 16PLY XRAY LF (GAUZE/BANDAGES/DRESSINGS) ×3 IMPLANT
GLOVE BIOGEL PI IND STRL 8 (GLOVE) ×1 IMPLANT
GLOVE BIOGEL PI INDICATOR 8 (GLOVE) ×2
GLOVE ORTHO TXT STRL SZ7.5 (GLOVE) ×3 IMPLANT
GLOVE SS BIOGEL STRL SZ 8.5 (GLOVE) ×2 IMPLANT
GLOVE SUPERSENSE BIOGEL SZ 8.5 (GLOVE) ×4
GOWN STRL REUS W/ TWL LRG LVL3 (GOWN DISPOSABLE) ×1 IMPLANT
GOWN STRL REUS W/TWL 2XL LVL3 (GOWN DISPOSABLE) ×6 IMPLANT
GOWN STRL REUS W/TWL LRG LVL3 (GOWN DISPOSABLE) ×3
GUIDEWIRE NITINOL BEVEL TIP (WIRE) ×12 IMPLANT
KIT BASIN OR (CUSTOM PROCEDURE TRAY) ×3 IMPLANT
KIT NEEDLE NVM5 EMG ELECT (KITS) ×1 IMPLANT
KIT NEEDLE NVM5 EMG ELECTRODE (KITS) ×2
KIT ROOM TURNOVER OR (KITS) ×3 IMPLANT
LIGHT SOURCE ANGLE TIP STR 7FT (MISCELLANEOUS) ×3 IMPLANT
NEEDLE HYPO 25X1 1.5 SAFETY (NEEDLE) ×3 IMPLANT
NEEDLE I-PASS III (NEEDLE) ×3 IMPLANT
NEEDLE SPNL 18GX3.5 QUINCKE PK (NEEDLE) ×6 IMPLANT
NEEDLE SSEP/EMG (NEEDLE) ×3 IMPLANT
NS IRRIG 1000ML POUR BTL (IV SOLUTION) ×3 IMPLANT
NVM5 NEEDLE MODULE, SSEP ×2 IMPLANT
PACK SURGICAL SETUP 50X90 (CUSTOM PROCEDURE TRAY) ×3 IMPLANT
PACK UNIVERSAL I (CUSTOM PROCEDURE TRAY) ×3 IMPLANT
PAD ARMBOARD 7.5X6 YLW CONV (MISCELLANEOUS) ×6 IMPLANT
PROBE BALL TIP NVM5 SNG USE (BALLOONS) ×3 IMPLANT
PUTTY DBX 1CC (Putty) ×3 IMPLANT
PUTTY DBX 1CC DEPUY (Putty) ×1 IMPLANT
ROD RELINE MAS LORD 5.5X45MM (Rod) ×3 IMPLANT
ROD RELINE MAS LORD 5.5X50 (Rod) ×3 IMPLANT
SCREW RELINE RED 6.5X45MM POLY (Screw) ×3 IMPLANT
SCREW SHANK RELINE 6.5X45MM 2C (Screw) ×3 IMPLANT
SHEET CONFORM 45LX20WX5H (Bone Implant) ×3 IMPLANT
SURGIFLO W/THROMBIN 8M KIT (HEMOSTASIS) ×3 IMPLANT
SUT BONE WAX W31G (SUTURE) ×3 IMPLANT
SUT MON AB 3-0 SH 27 (SUTURE) ×6
SUT MON AB 3-0 SH27 (SUTURE) ×2 IMPLANT
SUT VIC AB 1 CT1 18XCR BRD 8 (SUTURE) ×2 IMPLANT
SUT VIC AB 1 CT1 8-18 (SUTURE) ×6
SUT VIC AB 2-0 CT1 18 (SUTURE) ×6 IMPLANT
SUT VIC AB 2-0 CT1 27 (SUTURE) ×3
SUT VIC AB 2-0 CT1 TAPERPNT 27 (SUTURE) ×1 IMPLANT
SYR CONTROL 10ML LL (SYRINGE) ×3 IMPLANT
TOWEL OR 17X24 6PK STRL BLUE (TOWEL DISPOSABLE) ×6 IMPLANT
TOWEL OR 17X26 10 PK STRL BLUE (TOWEL DISPOSABLE) ×3 IMPLANT
TRAY KYPHOPAK 15/3 ONESTEP 1ST (MISCELLANEOUS) ×3 IMPLANT
TRAY KYPHOPAK 20/3 ONESTEP 1ST (MISCELLANEOUS) IMPLANT
WATER STERILE IRR 1000ML POUR (IV SOLUTION) IMPLANT

## 2015-07-08 NOTE — Transfer of Care (Signed)
Immediate Anesthesia Transfer of Care Note  Patient: [name]  Procedure(s) Performed: Procedure(s): L1 KYPHOPLASTY (N/A) L5 - S1 TRANSFORAMINAL LUMBAR INTERBODY FUSION (TLIF) WITH PEDICLE SCREW FIXATION 1 LEVEL (N/A)  Patient Location: PACU  Anesthesia Type:General  Level of Consciousness: awake, alert , oriented and patient cooperative  Airway & Oxygen Therapy: Patient Spontanous Breathing and Patient connected to nasal cannula oxygen  Post-op Assessment: Report given to RN, Post -op Vital signs reviewed and stable and Patient moving all extremities X 4  Post vital signs: Reviewed and stable  Last Vitals:  Filed Vitals:   07/08/15 1059  BP: 148/76  Pulse: 63  Temp: 36.4 C    Complications: No apparent anesthesia complications

## 2015-07-08 NOTE — Brief Op Note (Signed)
07/08/2015  5:13 PM  PATIENT:  Wendy PaganPeggy J Saunders  69 y.o. 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  PROCEDURE:  Procedure(s): L1 KYPHOPLASTY (N/A) L5 - S1 TRANSFORAMINAL LUMBAR INTERBODY FUSION (TLIF) WITH PEDICLE SCREW FIXATION 1 LEVEL (N/A)  SURGEON:  Surgeon(s) and Role:    * Venita Lickahari Leshonda Galambos, MD - Primary  PHYSICIAN ASSISTANT:   ASSISTANTS: Carmen [name]   ANESTHESIA:   general  EBL:  Total I/O In: 2000 [I.V.:2000] Out: 480 [Urine:130; Blood:350]  BLOOD ADMINISTERED:none  DRAINS: none   LOCAL MEDICATIONS USED:  MARCAINE     SPECIMEN:  No Specimen  DISPOSITION OF SPECIMEN:  N/A  COUNTS:  YES  TOURNIQUET:  * No tourniquets in log *  DICTATION: .Other Dictation: Dictation Number [id number]  PLAN OF CARE: Admit to inpatient   PATIENT DISPOSITION:  PACU - hemodynamically stable.

## 2015-07-08 NOTE — H&P (Signed)
History of Present Illness  The patient is a 69 year old female who comes in today for a preoperative History and Physical. The patient is scheduled for a L1 Kyphoplasty , L5-S1 TLIF to be performed by Dr. Debria Garret D. Shon Baton, MD at [facility] on 07/08/15 . Please see the hospital record for complete dictated history and physical. Increased pain with ambulation. She has difficulty straightening up when she gets up from a chair.  Additional reasons for visit:  Follow-up back is described as the following: The patient is being followed for their left-sided back pain. Symptoms reported today include: pain (left leg), pain at night, aching, stiffness, pain with weightbearing, difficulty ambulating, difficulty arising from chair, weakness, numbness (left leg to the toes), burning, pain with lifting, pain with sitting and pain with standing, while the patient does not report symptoms of: swelling, throbbing, locking, catching, popping, grinding, giving way or instability. The patient states that they are doing poorly. The following medication has been used for pain control: antiinflammatory medication (advil prn). The patient reports their current pain level to be 4 / 10 ("just numbness"). The patient presents today following MRI (lumbar @ [facility] 05-16-15). Holding something in front of her like a book will increase pain. She denies leg pain. She reports numbness in her LLE.   Allergies No Known Drug Allergies 08/18/2014  Family History Diabetes Mellitus  mother and grandmother fathers side Hypertension  mother and grandmother mothers side Rheumatoid Arthritis  grandmother mothers side and grandmother fathers side  Social History  Tobacco use  never smoker Alcohol use  never consumed alcohol Drug/Alcohol Rehab (Previously)  no Children  3 Current work status  retired Financial planner (Currently)  no Number of flights of stairs before winded  greater than 5 Living situation  live  with spouse Illicit drug use  no Pain Contract  no Marital status  married  Medication History Advil (  Tablet, Oral) Active.  Vitals  06/29/2015 8:48 AM Weight: 213 lb Height: 67.75in Body Surface Area: 2.09 m Body Mass Index: 32.63 kg/m  Pulse: 58 (Regular)  BP: 146/103 (Sitting, Left Arm, Standard)  General General Appearance-Not in acute distress. Orientation-Oriented X3. Build & Nutrition-Well nourished and Well developed.  Integumentary General Characteristics Surgical Scars - no surgical scar evidence of previous lumbar surgery. Lumbar Spine-Skin examination of the lumbar spine is without deformity, skin lesions, lacerations or abrasions.  Chest and Lung Exam Auscultation Breath sounds - Normal and Clear.  Cardiovascular Auscultation Rhythm - Regular rate and rhythm.  Abdomen Palpation/Percussion Palpation and Percussion of the abdomen reveal - Soft, Non Tender and No Rebound tenderness.  Peripheral Vascular Lower Extremity Palpation - Posterior tibial pulse - Bilateral - 2+. Dorsalis pedis pulse - Bilateral - 2+.  Neurologic Sensation Lower Extremity - Left - sensation is diminished in the lower extremity. Right - sensation is intact in the lower extremity. Reflexes Patellar Reflex - Bilateral - 2+. Achilles Reflex - Bilateral - 2+. Clonus - Bilateral - clonus not present. Hoffman's Sign - Bilateral - Hoffman's sign not present. Testing Seated Straight Leg Raise - Bilateral - Seated straight leg raise negative.  Musculoskeletal Spine/Ribs/Pelvis  Lumbosacral Spine: Inspection and Palpation - Tenderness - localized and mid lower back tender to palpation. Strength and Tone: Strength - Hip Flexion - Bilateral - 5/5. Knee Extension - Bilateral - 5/5. Knee Flexion - Bilateral - 5/5. Ankle Dorsiflexion - Bilateral - 5/5. Ankle Plantarflexion - Bilateral - 5/5. Heel walk - Bilateral - unable to heel walk.  Toe Walk - Bilateral - able to  walk on toes with moderate difficulty. Heel-Toe Walk - Bilateral - able to heel-toe walk without difficulty. ROM - Flexion - moderately decreased range of motion and painful. Extension - moderately decreased range of motion and painful. Left Lateral Bending - moderately decreased range of motion and painful. Right Lateral Bending - moderately decreased range of motion and painful. Right Rotation - mildly decreased range of motion and painful. Left Rotation - mildly decreased range of motion and painful. Pain - . Lumbosacral Spine - Waddell's Signs - no Waddell's signs present. Lower Extremity Range of Motion - No true hip, knee or ankle pain with range of motion. Gait and Station - [employer] - no assistive devices.  Her new MRI from 05/15/2014 shows a new compression fracture of L1 that has developed since her previous MRI. There are no axial cuts through this, only the sagittal. 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 At this point in time after a long discussion, the patient has just as much back pain as she does leg pain.   This has gotten progressively worse despite physical therapy, activity modification, medication, injection. At this point, we have had a long talk about surgery. In order to address her problem, I have recommended an L1 kyphoplasty as well as TLIF of L5-S1. The TLIF will allow me direct visualization of the L5 and S1 nerve roots to ensure their adequate decompression and then stabilization after the extensive decompression that would be required. It will also allow me to address the discogenic back pain that she is currently suffering from. We reviewed the risks which include the cement leak, infection, bleeding, nerve damage, death, stroke, paralysis, other fractures, need for further surgery, ongoing or worse pain, loss of bowel and bladder control. All of her  questions were encouraged and addressed.   Goal Of Surgery:Discussed that goal of surgery is to reduce pain and improve function and quality of life. Patient is aware that despite all appropriate treatment that there pain and function could be the same, worse, or different. Posterior Lumbar Decompression/disectomy: Risks of surgery include infection, bleeding, nerve damage, death, stroke, paralysis, failure to heal, need for further surgery, ongoing or worse pain, need for further surgery, CSF leak, loss of bowel or bladder, and recurrent disc herniation or Stenosis which would necessitate need for further surgery.

## 2015-07-08 NOTE — Anesthesia Procedure Notes (Signed)
Procedure Name: Intubation Date/Time: 07/08/2015 1:19 PM Performed by: Salomon MastWALL, Alvia Jablonski COREY Pre-anesthesia Checklist: Patient identified, Emergency Drugs available, Suction available and Patient being monitored Patient Re-evaluated:Patient Re-evaluated prior to inductionOxygen Delivery Method: Circle system utilized Preoxygenation: Pre-oxygenation with 100% oxygen Intubation Type: IV induction Ventilation: Mask ventilation without difficulty Laryngoscope Size: Mac and 3 Grade View: Grade I Tube type: Oral Tube size: 7.0 mm Number of attempts: 1 Airway Equipment and Method: Stylet Placement Confirmation: ETT inserted through vocal cords under direct vision,  positive ETCO2 and breath sounds checked- equal and bilateral Secured at: 23 cm Dental Injury: Teeth and Oropharynx as per pre-operative assessment

## 2015-07-08 NOTE — Progress Notes (Signed)
Arrived to fllor, pt c/o pain. Nurse from unit named DenmarkJena medicated pt.for pain

## 2015-07-09 ENCOUNTER — Inpatient Hospital Stay (HOSPITAL_COMMUNITY): Payer: BLUE CROSS/BLUE SHIELD

## 2015-07-09 ENCOUNTER — Encounter (HOSPITAL_COMMUNITY): Payer: Self-pay | Admitting: Orthopedic Surgery

## 2015-07-09 MED FILL — Thrombin For Soln 20000 Unit: CUTANEOUS | Qty: 1 | Status: AC

## 2015-07-09 NOTE — Evaluation (Signed)
Physical Therapy Evaluation Patient Details Name: [name] J Garza MRN: [id number] DOB: 13-Jan-1946 Today's Date: 07/09/2015   History of Present Illness  69 y.o. female s/p L1 kyphoplasty and transforaminal lumbar interbody fusion at L5-S1.  Clinical Impression  Patient is s/p above surgery resulting in the deficits listed below (see PT Problem List).  Patient will benefit from skilled PT to increase their independence and safety with mobility (while adhering to their precautions) to allow discharge to the venue listed below.     Follow Up Recommendations No PT follow up    Equipment Recommendations  Rolling walker with 5" wheels    Recommendations for Other Services       Precautions / Restrictions Precautions Precautions: Back;Fall Required Braces or Orthoses: Spinal Brace Spinal Brace: Lumbar corset;Applied in sitting position      Mobility  Bed Mobility Overal bed mobility: Independent Bed Mobility: Rolling;Sidelying to Sit Rolling: Independent Sidelying to sit: Independent       General bed mobility comments: HOB flat, no use of bedrails.   Transfers Overall transfer level: Modified independent Equipment used: Rolling walker (2 wheeled) Transfers: Sit to/from Stand Sit to Stand: Modified independent (Device/Increase time)         General transfer comment: pt able to recall correct hand placement  Ambulation/Gait Ambulation/Gait assistance: Modified independent (Device/Increase time) Ambulation Distance (Feet): 100 Feet Assistive device: Rolling walker (2 wheeled) Gait Pattern/deviations: WFL(Within Functional Limits)   Gait velocity interpretation: Below normal speed for age/gender    Stairs            Wheelchair Mobility    Modified Rankin (Stroke Patients Only)       Balance           Standing balance support: No upper extremity supported;During functional activity Standing balance-Leahy Scale: Fair                                Pertinent Vitals/Pain Pain Assessment: 0-10 Pain Score: 4  Pain Location: back Pain Descriptors / Indicators: Aching Pain Intervention(s): Limited activity within patient's tolerance;Monitored during session;Repositioned    Home Living Family/patient expects to be discharged to:: Private residence Living Arrangements: Spouse/significant other Available Help at Discharge: Family;Available 24 hours/day Type of Home: House Home Access: Stairs to enter Entrance Stairs-Rails: None Entrance Stairs-Number of Steps: 3 Home Layout: One level Home Equipment: None Additional Comments: Pt's husband works during the day. Pt's 2 daughters will be available Friday-Monday 24/7.    Prior Function Level of Independence: Independent               Hand Dominance   Dominant Hand: Right    Extremity/Trunk Assessment               Lower Extremity Assessment: Overall WFL for tasks assessed         Communication   Communication: No difficulties  Cognition Arousal/Alertness: Awake/alert Behavior During Therapy: WFL for tasks assessed/performed Overall Cognitive Status: Within Functional Limits for tasks assessed                      General Comments      Exercises        Assessment/Plan    PT Assessment Patient needs continued PT services  PT Diagnosis Difficulty walking;Acute pain   PT Problem List Decreased activity tolerance;Decreased balance;Decreased knowledge of use of DME;Decreased safety awareness;Decreased knowledge of precautions;Pain  PT Treatment Interventions DME instruction;Gait training;Stair  training;Therapeutic exercise;Therapeutic activities;Functional mobility training;Patient/family education;Balance training   PT Goals (Current goals can be found in the Care Plan section) Acute Rehab PT Goals Patient Stated Goal: to go home PT Goal Formulation: With patient Time For Goal Achievement: 07/16/15 Potential to Achieve Goals: Good     Frequency Min 5X/week   Barriers to discharge        Co-evaluation               End of Session Equipment Utilized During Treatment: Back brace;Gait belt Activity Tolerance: Patient tolerated treatment well;No increased pain Patient left: in chair;with call bell/phone within reach Nurse Communication: Mobility status         Time: 9562-1308 PT Time Calculation (min) (ACUTE ONLY): 23 min   Charges:   PT Evaluation $Initial PT Evaluation Tier I: 1 Procedure PT Treatments $Gait Training: 8-22 mins   PT G Codes:       Clarita Crane, PT, DPT 07/09/2015 1:24 PM [phone]

## 2015-07-09 NOTE — Progress Notes (Signed)
Occupational Therapy Evaluation Patient Details Name: [name] MRN: [id number] DOB: 09/18/1945 Today's Date: 07/09/2015    History of Present Illness 69 y.o. female s/p L1 kyphoplasty and transforaminal lumbar interbody fusion at L5-S1.   Clinical Impression   PTA, pt was independent with all ADLs and functional mobility. Currently, pt is slightly impulsive and required min guard assist for transfers and self-care tasks. Reviewed back precautions and compensatory strategies, pt required mod verbal cues to follow precautions during functional tasks. Pt will benefit from acute skilled OT to increase independence and safety with ADLs and mobility to allow safe discharge home with assist from family. Will continue to follow acutely.     Follow Up Recommendations  No OT follow up;Supervision/Assistance - 24 hour    Equipment Recommendations  None recommended by OT (RW-2 wheeled)    Recommendations for Other Services       Precautions / Restrictions Precautions Precautions: Back;Fall Precaution Booklet Issued: Yes (comment) Precaution Comments: Reviewed back precautions. Pt able to recall 1/3 back precautions at end of session. Required Braces or Orthoses: Spinal Brace Spinal Brace: Lumbar corset;Applied in sitting position Restrictions Weight Bearing Restrictions: No      Mobility Bed Mobility Overal bed mobility: Needs Assistance Bed Mobility: Rolling;Sidelying to Sit;Sit to Sidelying Rolling: Min guard Sidelying to sit: Min guard     Sit to sidelying: Min guard General bed mobility comments: HOB flat, no use of bedrails. Verbal cues for log roll technique - pt with good return demonstration. At end of session, pt reported her bed is high - will simulate this next session.  Transfers Overall transfer level: Needs assistance Equipment used: Rolling walker (2 wheeled) Transfers: Sit to/from Stand Sit to Stand: Min guard         General transfer comment: Min  guard assist for safety. Verbal cues for safe hand placement on seated surfaces and to avoid bending.    Balance Overall balance assessment: Needs assistance Sitting-balance support: No upper extremity supported;Feet supported Sitting balance-Leahy Scale: Fair     Standing balance support: No upper extremity supported;During functional activity Standing balance-Leahy Scale: Poor Standing balance comment: Bilateral knee buckling noted durign grooming tasks at sink                            ADL Overall ADL's : Needs assistance/impaired     Grooming: Wash/dry hands;Wash/dry face;Oral care;Min guard;Cueing for compensatory techniques;Standing Grooming Details (indicate cue type and reason): Cues for 2 cup method for oral care Upper Body Bathing: Min guard;Sitting   Lower Body Bathing: Min guard;Cueing for compensatory techniques;Cueing for back precautions;Sitting/lateral leans Lower Body Bathing Details (indicate cue type and reason): Verbal cues to bring ankle across knee to reach LB and to adhere to back precautions Upper Body Dressing : Set up;Supervision/safety;Sitting Upper Body Dressing Details (indicate cue type and reason): Hospital gown and back brace Lower Body Dressing: Min guard;Cueing for compensatory techniques;Cueing for back precautions;Sitting/lateral leans Lower Body Dressing Details (indicate cue type and reason): Don/doff socks only. Verbal cues to cross ankle over knee to adhere to back precautions Toilet Transfer: Min guard;Cueing for safety;Ambulation;BSC;RW Toilet Transfer Details (indicate cue type and reason): Verbal cues to slow down despite urgency to void and to position RW safely Toileting- Clothing Manipulation and Hygiene: Min guard;Cueing for back precautions;Sitting/lateral lean Toileting - Clothing Manipulation Details (indicate cue type and reason): Verbal cues to avoid twisting     Functional mobility during ADLs: Min guard;Cueing  for  safety;Rolling walker General ADL Comments: Pt was slightly impulsive during session and required verbal cues to adhere to back precautions and to use DME safely during selfcare tasks and transfers. Practiced compenastory strategies for oral care, LB dressing, and pericare. Pt's daughter is very supportive and helpful.      Vision Vision Assessment?: Yes Eye Alignment: Within Functional Limits Ocular Range of Motion: Within Functional Limits Alignment/Gaze Preference: Within Defined Limits Tracking/Visual Pursuits: Able to track stimulus in all quads without difficulty Convergence: Within functional limits Visual Fields: No apparent deficits   Perception     Praxis      Pertinent Vitals/Pain Pain Assessment: 0-10 Pain Score: 3  Pain Location: Back Pain Descriptors / Indicators: Aching;Operative site guarding Pain Intervention(s): Limited activity within patient's tolerance;Monitored during session;Repositioned     Hand Dominance Right   Extremity/Trunk Assessment Upper Extremity Assessment Upper Extremity Assessment: Overall WFL for tasks assessed   Lower Extremity Assessment Lower Extremity Assessment: Defer to PT evaluation   Cervical / Trunk Assessment Cervical / Trunk Assessment: Normal   Communication Communication Communication: No difficulties   Cognition Arousal/Alertness: Awake/alert Behavior During Therapy: WFL for tasks assessed/performed Overall Cognitive Status: Within Functional Limits for tasks assessed                     General Comments       Exercises       Shoulder Instructions      Home Living Family/patient expects to be discharged to:: Private residence Living Arrangements: Spouse/significant other Available Help at Discharge: Family;Available 24 hours/day (Until Monday 07/13/15) Type of Home: House Home Access: Stairs to enter [employer] of Steps: 3 Entrance Stairs-Rails: None Home Layout: One level      Bathroom Shower/Tub: Chief Strategy Officer: Handicapped height     Home Equipment: None   Additional Comments: Pt's husband works during the day. Pt's 2 daughters will be available Friday-Monday 24/7.      Prior Functioning/Environment Level of Independence: Independent             OT Diagnosis: Acute pain   OT Problem List: Decreased strength;Decreased range of motion;Decreased activity tolerance;Impaired balance (sitting and/or standing);Decreased coordination;Decreased safety awareness;Decreased knowledge of use of DME or AE;Decreased knowledge of precautions;Impaired sensation;Pain   OT Treatment/Interventions: Self-care/ADL training;Therapeutic exercise;DME and/or AE instruction;Therapeutic activities;Patient/family education;Balance training    OT Goals(Current goals can be found in the care plan section) Acute Rehab OT Goals Patient Stated Goal: to go home OT Goal Formulation: With patient Time For Goal Achievement: 07/23/15 Potential to Achieve Goals: Good ADL Goals Pt Will Perform Lower Body Bathing: with modified independence;with adaptive equipment;sitting/lateral leans;sit to/from stand Pt Will Perform Lower Body Dressing: with modified independence;with adaptive equipment;sitting/lateral leans;sit to/from stand Pt Will Transfer to Toilet: with supervision;ambulating;regular height toilet Pt Will Perform Toileting - Clothing Manipulation and hygiene: with supervision;with adaptive equipment;sitting/lateral leans;sit to/from stand Pt Will Perform Tub/Shower Transfer: Tub transfer;with supervision;ambulating;rolling walker Additional ADL Goal #1: Pt will demonstrate understanding of 3/3 back precautions to promote safety during selfcare tasks. Additional ADL Goal #2: Pt will independently don/doff back brace to increase independence with selfcare tasks.  OT Frequency: Min 2X/week   Barriers to D/C:            Co-evaluation              End  of Session Equipment Utilized During Treatment: Gait belt;Rolling walker;Back brace Nurse Communication: Mobility status;Precautions  Activity Tolerance: Patient tolerated treatment  well Patient left: in bed;with call bell/phone within reach;with bed alarm set;with family/visitor present   Time: 0829-0905 OT Time Calculation (min): 36 min Charges:  OT General Charges $OT Visit: 1 Procedure OT Evaluation $Initial OT Evaluation Tier I: 1 Procedure OT Treatments $Self Care/Home Management : 8-22 mins G-Codes:    Nils Pyle, OTR/L 08-08-2015, 10:00 AM

## 2015-07-09 NOTE — Op Note (Signed)
NAMELAURINDA, [name]              ACCOUNT NO.:  [id number]  MEDICAL RECORD NO.:  [id number]  LOCATION:  5C09C                        FACILITY:  [facility]  PHYSICIAN:  Quinlee Sciarra D. Shon Baton, M.D. DATE OF BIRTH:  May 29, 1946  DATE OF PROCEDURE:  07/08/2015 DATE OF DISCHARGE:                              OPERATIVE REPORT   PREOPERATIVE DIAGNOSES: 1. Osteoporotic compression fracture of L1. 2. 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  POSTOPERATIVE DIAGNOSES: 1. Osteoporotic compression fracture of L1. 2. 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  OPERATIVE PROCEDURES: 1. L1 kyphoplasty. 2. Transforaminal lumbar interbody fusion at L5-S1 using a size 8     extra long Titan titanium intervertebral cage.  The cage was packed     with autograft from the decompression along with DBX and along the     anterior aspect of the disk space we placed Conform allograft     sheets.  We used a pedicle screw fixation at L5-S1, was NuVasive,     6.5 mm diameter pedicle screws, 45 mm length at L5, 40 mm length at     S1.  COMPLICATIONS:  No intraoperative complications.  Neuromonitoring throughout the case with no adverse EMG activity or abnormality of the SSEPs.  FIRST ASSISTANT:  Anette Riedel, [place].  HISTORY:  This is a very pleasant 69 year old woman who has been having significant back pain, buttock pain, and neuropathic left leg pain. Attempts at conservative management have failed to alleviate her symptoms.  Because of the location of her pain and the severity, we elected to proceed with surgery.  All appropriate risks, benefits, and alternatives were discussed with the patient, and consent was obtained.  OPERATIVE NOTE:  The patient was brought to the operating room,  placed supine on the operating table.  After successful induction of general anesthesia and endotracheal intubation, TED, SCDs, and Foley were inserted.  The neuromonitoring representative then placed all appropriate needles for intraoperative neuromonitoring.  The patient was turned prone onto the Wilson frame.  All bony prominences were well padded, and the back was prepped and draped in standard fashion.  Time- out was taken confirming the patient, procedure, and all other pertinent important data.  Two C arms were brought into the field sterilely; one in the AP, the other in lateral centered at L1.  I did count up from L5 confirming the appropriate level.  I identified the lateral aspect of the L5 pedicle. I made a small stab incision and advanced the Jamshidi needle percutaneously down to the lateral aspect of the L1 pedicle.  I confirmed satisfactory position in both planes and then advanced the Jamshidi needle through the pedicle.  Once I was nearing the medial wall of the L1 pedicle, I took a picture with the lateral and confirmed I was beyond the posterior aspect of the vertebral wall confirming satisfactory transpedicular approach.  I then advanced into the vertebral body.  I repeated  the same exact procedure on the contralateral side.  Once I had both pedicles cannulated, I then placed a drill there and then inserted the inflatable bone tamps.  I then continued to inflate until I had adequate insufflation.  I then removed 1 balloon, inserted the cement.  I did notice that during the insertion of the cement that the contralateral balloon did pop and there was extravasation of contrast.  I then placed cement on the contralateral side.  I then inserted the cement and observed using fluoro to ensure there was no significant leak.  Once I had an adequate fill, I did note when it was completed, there was some migration laterally, but no posterior or intradiscal spread of the  material of the cement.  Once the cement was allowed to harden, I removed the inserting Jamshidi needles, wash the skin and closed the skin with simple interrupted sutures with 3- 0 Monocryl.  I then turned my attention to the TLIF.  Because the left leg was more symptomatic side, it was decided and elected to do TLIF approach.  On the right side, I identified the lateral border of the L5 and S1 pedicles.  I made a stab incision and advanced the Jamshidi needle percutaneously down to the lateral aspect of the facet complex.  I confirmed satisfactory positioning in the AP plane.  I then advanced the Jamshidi needle while stimulating it and monitoring its progress on the AP.  Once I was nearing the medial border of the pedicle, I then flipped the lateral to confirm that I was beyond the posterior wall of the vertebral body.  Once I confirmed this, I then advanced into the vertebral body.  I then aspirated 10 mL of marrow blood and then placed the guide pin.  I repeated this exact procedure at S1.  Once both pedicles were cannulated, I then tapped over the guide pin and then placed the appropriate size pedicle screws at each level. I then stimulated both pedicle screws and they both stemmed above 20 indicating there was no evidence of breach.  The fluoro views were also satisfactory for positioning and trajectory.  At this point, I then went to the contralateral side.  I made an incision just on the lateral border of the pedicle of L5-S1 and sharply dissected down to the deep fascia.  I incised the fascia and bluntly dissected through the paraspinal muscle to the lateral aspect of the facet complex.  I then placed the Jamshidi needle and using the same technique that I had used on the contralateral side, I cannulated the L5 and S1 pedicles.  I then placed the pedicle screws, which were tapped and then placed the retractors, which were attached, the pedicle screws in place.  I then  mobilized the paraspinal muscles medially and placed my retractor.  I could now see the L5 lamina and the L4-L5 and L5-S1 facet capsules.  Using a Bovie, I removed the facet capsule and then used a 3 and 4 mm Kerrison rongeur to perform a very generous laminotomy of L5.  Using an osteotome, I  resected the entire inferior facet complex of L5.  I then used the [place] 4 to gently dissect through the ligamentum flavum. Once I was through, I then used my 2 and 3 mm Kerrison punch to remove the ligamentum flavum.  At this point, I could now visualize the posterolateral aspect of the thecal sac, the S1 nerve root traveling in the lateral recess towards the S1 foramen  and the L5 nerve root.  I used my 3 mm Kerrison to complete the resection of the L5 pars.  I then also resected the medial aspect of the S1 superior facet until I removed all the osteophyte and was flushed to the medial wall of the pedicle.  I then performed generous foraminotomy using a 2 and 3 mm Kerrison punch. At this point, I had an adequate decompression of the S1 and L5 nerve roots.  The [facility] elevator freely passed superiorly and inferiorly.  I now had visualization of the disk space.  There were very large epidural veins, which were coagulated with bipolar.  I mobilized the thecal sac and protected it.  I then incised the disk space with a 15 blade scalpel.  Using a combination of pituitary rongeurs, angled curettes, Kerrison rongeurs, I resected all of the disk material.  I scraped the endplates, so I knew I had removed the cartilaginous endplate and I had bleeding subchondral bone.  I trialed with a smooth paddles and elected to use a size 8.  The implant was obtained, packed with the local bone that had harvested from the decompression and secured with DBX.  With the thecal sac protecting the nerve root, protected with neural patties, I advanced the implant into the disk space of L5-S1.  It should be noted that  prior to implanting the cage, I did put 2 strips of Conform allograft sheet along the anterior annulus.  Once the [facility] was in the disk space, I then kicked it over to the contralateral side, so its final resting position was horizontal in the anterior third of the disk space.  At this point, I rechecked the nerve root and thecal sac.  Everything was still decompressed.  There was no significant bleeding.  At this point, I irrigated copiously with normal saline and then inserted the 45 mm length of rod and then the top locking nuts.  They were locked down and torqued off according to manufacturer's standards.  This wound was irrigated copiously with normal saline.  I made sure hemostasis using bipolar electrocautery and FloSeal.  I then placed a thrombin-soaked Gelfoam patty over the lamina defect.  I closed the deep fascia in a layered fashion with interrupted #1 Vicryl sutures, 2-0 Vicryl sutures, and a 3-0 Monocryl for the skin.  On the right-hand side, I advanced my rod percutaneously and made sure in both screw, the rod was properly seated.  I then placed the locking caps and advanced them down and then torqued them off according to manufacturer's standards.  I then irrigated these wounds and closed in a layered fashion with #1 Vicryl suture, 2-0 Vicryl suture, and 3-0 Monocryl.  Steri-Strips, dry dressing were applied, and the patient was ultimately extubated and transferred to PACU without incident.  At the end of the case, all needle and sponge counts were correct.  There were no adverse intraoperative events.     [name], M.D.     DDB/MEDQ  D:  07/08/2015  T:  07/09/2015  Job:  [id number]  cc:   Anette Riedelarmen [name]

## 2015-07-09 NOTE — Anesthesia Postprocedure Evaluation (Signed)
Anesthesia Post Note  Patient: [name]  Procedure(s) Performed: Procedure(s) (LRB): L1 KYPHOPLASTY (N/A) L5 - S1 TRANSFORAMINAL LUMBAR INTERBODY FUSION (TLIF) WITH PEDICLE SCREW FIXATION 1 LEVEL (N/A)  Patient location during evaluation: PACU Anesthesia Type: General Level of consciousness: awake and alert Pain management: pain level controlled Vital Signs Assessment: post-procedure vital signs reviewed and stable Respiratory status: spontaneous breathing, nonlabored ventilation, respiratory function stable and patient connected to nasal cannula oxygen Cardiovascular status: blood pressure returned to baseline and stable Postop Assessment: no signs of nausea or vomiting Anesthetic complications: no    Last Vitals:  Filed Vitals:   07/08/15 2141 07/09/15 0202  BP: 90/49 93/53  Pulse: 65 51  Temp: 36.4 C 36.4 C  Resp: 16 15    Last Pain:  Filed Vitals:   07/09/15 0206  PainSc: Asleep                 Anali Cabanilla

## 2015-07-09 NOTE — Progress Notes (Addendum)
Patient ID: [name], female   DOB: [birth year], 69 y.o.   MRN: [id number]    Subjective: 1 Day Post-Op Procedure(s) (LRB): L1 KYPHOPLASTY (N/A) L5 - S1 TRANSFORAMINAL LUMBAR INTERBODY FUSION (TLIF) WITH PEDICLE SCREW FIXATION 1 LEVEL (N/A) Patient reports pain as 3 on 0-10 scale.   Denies CP or SOB.  Voiding without difficulty. Positive BM Objective: Vital signs in last 24 hours: Temp:  [97.5 F (36.4 C)-97.6 F (36.4 C)] 97.6 F (36.4 C) (12/08 0544) Pulse Rate:  [51-68] 60 (12/08 0544) Resp:  [8-18] 18 (12/08 0544) BP: (90-148)/(49-76) 92/52 mmHg (12/08 0544) SpO2:  [92 %-98 %] 94 % (12/08 0544) Weight:  [93.895 kg (207 lb)] 93.895 kg (207 lb) (12/07 1059)  Intake/Output from previous day: 12/07 0701 - 12/08 0700 In: 3000 [I.V.:3000] Out: 1430 [Urine:1080; Blood:350] Intake/Output this shift:    Labs:  Recent Labs  07/06/15 1314  HGB 13.9    Recent Labs  07/06/15 1314  WBC 6.2  RBC 4.77  HCT 41.3  PLT 179   No results for input(s): NA, K, CL, CO2, BUN, CREATININE, GLUCOSE, CALCIUM in the last 72 hours. No results for input(s): LABPT, INR in the last 72 hours.  Physical Exam: Neurologically intact ABD soft Sensation intact distally Intact pulses distally Dorsiflexion/Plantar flexion intact Incision: dressing C/D/I Compartment soft  Assessment/Plan: 1 Day Post-Op Procedure(s) (LRB): L1 KYPHOPLASTY (N/A) L5 - S1 TRANSFORAMINAL LUMBAR INTERBODY FUSION (TLIF) WITH PEDICLE SCREW FIXATION 1 LEVEL (N/A) Advance diet Up with therapy  Consider DC today - Discuss with Dr. Chad CordialBrooks  [name], [name] [name] for Dr. Venita Lickahari [name] [facility] [phone] 07/09/2015, 7:39 AM    Patient doing well Ambulating xrays results noted - will hold off on CT as patient improving and has no neurologic deficits Continue care Plan on d/c in AM

## 2015-07-09 NOTE — Care Management Note (Signed)
Case Management Note  Patient Details  Name: [name] MRN: [id number] Date of Birth: 23-Nov-1945  Subjective/Objective:                    Action/Plan: Patient was admitted for a TLIF with pedicle screw fixation. Lives at home with spouse. Will follow for discharge needs pending PT/OT evals and physician orders.  Expected Discharge Date:                  Expected Discharge Plan:     In-House Referral:     Discharge planning Services     Post Acute Care Choice:    Choice offered to:     DME Arranged:    DME Agency:     HH Arranged:    HH Agency:     Status of Service:  In process, will continue to follow  Medicare Important Message Given:    Date Medicare IM Given:    Medicare IM give by:    Date Additional Medicare IM Given:    Additional Medicare Important Message give by:     If discussed at Long Length of Stay Meetings, dates discussed:    Additional Comments:  Anda KraftRobarge, Antonino Nienhuis C, RN 07/09/2015, 10:16 AM

## 2015-07-10 NOTE — Progress Notes (Signed)
Occupational Therapy Treatment/Discharge Patient Details Name: [name] MRN: [id number] DOB: [birth year] Today's Date: 07/10/2015    History of present illness 69 y.o. female s/p L1 kyphoplasty and transforaminal lumbar interbody fusion at L5-S1.   OT comments  Pt making good progress and adequate to d/c home with family assist from OT standpoint. Pt completed all self-care tasks and functional transfers with supervision-mod independence. Pt continues to require verbal cues for safety and to adhere to back precautions during functional tasks. Educated pt on compensatory and fall prevention strategies. All education completed and pt has no further questions or acute OT needs. OT signing off.    Follow Up Recommendations  No OT follow up;Supervision/Assistance - 24 hour    Equipment Recommendations  Tub/shower seat;Other (comment) (RW- 2 wheeled; Pt's daughter will obtain shower seat)    Recommendations for Other Services      Precautions / Restrictions Precautions Precautions: Back;Fall Precaution Comments: Pt able to recall 3/3 back precautions at end of session. Required Braces or Orthoses: Spinal Brace Spinal Brace: Lumbar corset;Applied in sitting position Restrictions Weight Bearing Restrictions: No       Mobility Bed Mobility Overal bed mobility: Needs Assistance Bed Mobility: Rolling;Sidelying to Sit Rolling: Supervision Sidelying to sit: Supervision       General bed mobility comments: HOB flat, no use of bedrails, raised height of bed to simulate home environment. Supervision for safety and verbal cues to use log roll technique  Transfers Overall transfer level: Needs assistance Equipment used: Rolling walker (2 wheeled) Transfers: Sit to/from Stand Sit to Stand: Supervision         General transfer comment: Supervision for safety and verbal cues to avoid bending upon standing and to avoid twisting to look at approaching suface when sitting.      Balance Overall balance assessment: Needs assistance Sitting-balance support: No upper extremity supported;Feet supported Sitting balance-Leahy Scale: Good     Standing balance support: No upper extremity supported;During functional activity Standing balance-Leahy Scale: Fair Standing balance comment: Pt with bilateral knee buckling while completing grooming tasks at sink. No overt LOB                   ADL Overall ADL's : Needs assistance/impaired     Grooming: Wash/dry hands;Wash/dry face;Oral care;Supervision/safety;Set up;Standing   Upper Body Bathing: Supervision/ safety;Set up;Sitting   Lower Body Bathing: Adhering to back precautions;Sit to/from stand;Supervison/ safety;Set up   Upper Body Dressing : Supervision/safety;Sitting   Lower Body Dressing: Supervision/safety;Set up;Sit to/from stand   Toilet Transfer: Supervision/safety;Ambulation;BSC;RW   Toileting- Clothing Manipulation and Hygiene: Supervision/safety;Cueing for back precautions;Sit to/from stand Toileting - Clothing Manipulation Details (indicate cue type and reason): Verbal cues to avoid bending Tub/ Shower Transfer: Tub transfer;Supervision/safety;Adhering to back precautions;Ambulation;Rolling walker   Functional mobility during ADLs: Supervision/safety;Rolling walker General ADL Comments: Continues to need verbal cues to adhere to back precautions and to slow down pace when ambulating in hallway. Educated pt and daughter on compensatory and fall prevention strategies and AE. Pt's daughter will obtain a shower seat for her - pt does not feel AE will be useful       Vision                     Perception     Praxis      Cognition   Behavior During Therapy: WFL for tasks assessed/performed Overall Cognitive Status: Within Functional Limits for tasks assessed  Extremity/Trunk Assessment               Exercises     Shoulder Instructions        General Comments      Pertinent Vitals/ Pain       Pain Assessment: 0-10 Pain Score: 4  Pain Location: back Pain Descriptors / Indicators: Sore Pain Intervention(s): Limited activity within patient's tolerance;Monitored during session;Repositioned  Home Living                                          Prior Functioning/Environment              Frequency       Progress Toward Goals  OT Goals(current goals can now be found in the care plan section)  Progress towards OT goals: Progressing toward goals  Acute Rehab OT Goals Patient Stated Goal: to go home OT Goal Formulation: With patient Time For Goal Achievement: 07/23/15 Potential to Achieve Goals: Good ADL Goals Pt Will Perform Lower Body Bathing: with modified independence;with adaptive equipment;sitting/lateral leans;sit to/from stand Pt Will Perform Lower Body Dressing: with modified independence;with adaptive equipment;sitting/lateral leans;sit to/from stand Pt Will Transfer to Toilet: with supervision;ambulating;regular height toilet Pt Will Perform Toileting - Clothing Manipulation and hygiene: with supervision;with adaptive equipment;sitting/lateral leans;sit to/from stand Pt Will Perform Tub/Shower Transfer: Tub transfer;with supervision;ambulating;rolling walker Additional ADL Goal #1: Pt will demonstrate understanding of 3/3 back precautions to promote safety during selfcare tasks. Additional ADL Goal #2: Pt will independently don/doff back brace to increase independence with selfcare tasks.  Plan All goals met and education completed, patient discharged from OT services    Co-evaluation                 End of Session Equipment Utilized During Treatment: Gait belt;Rolling walker;Back brace   Activity Tolerance Patient tolerated treatment well   Patient Left in chair;with call bell/phone within reach;with family/visitor present   Nurse Communication Mobility status;Precautions;Other  (comment) (Pt up in chair with door open - no chair alarm box in room)        Time: 2778-2423 OT Time Calculation (min): 31 min  Charges: OT General Charges $OT Visit: 1 Procedure OT Treatments $Self Care/Home Management : 23-37 mins  Redmond Baseman, OTR/L 07/10/2015, 9:38 AM

## 2015-07-10 NOTE — Progress Notes (Signed)
Physical Therapy Treatment Patient Details Name: [name] MRN: [id number] DOB: 1946/02/05 Today's Date: 07/10/2015    History of Present Illness 69 y.o. female s/p L1 kyphoplasty and transforaminal lumbar interbody fusion at L5-S1.    PT Comments    Pt progressing well. She anticipates DC to home today. Answered all mobility questions and advised to gradually increase activity level.  Follow Up Recommendations        Equipment Recommendations       Recommendations for Other Services       Precautions / Restrictions Precautions Precautions: Back;Fall Precaution Booklet Issued: Yes (comment) Precaution Comments: Pt able to recall 3/3 back precautions without cueing Required Braces or Orthoses: Spinal Brace Spinal Brace: Lumbar corset;Applied in sitting position Restrictions Weight Bearing Restrictions: No    Mobility  Bed Mobility Overal bed mobility: Needs Assistance Bed Mobility: Rolling;Sidelying to Sit Rolling: Supervision Sidelying to sit: Supervision       General bed mobility comments: NT, pt up in chair. reports no difficulty with bed mobility  Transfers Overall transfer level: Needs assistance Equipment used: Rolling walker (2 wheeled) Transfers: Sit to/from Stand Sit to Stand: Supervision         General transfer comment: demonstrated safe technique.  Took increased time and use of UE's for transfer  Ambulation/Gait Ambulation/Gait assistance: Modified independent (Device/Increase time) Ambulation Distance (Feet): 100 Feet Assistive device: Rolling walker (2 wheeled) Gait Pattern/deviations: WFL(Within Functional Limits)   Gait velocity interpretation: Below normal speed for age/gender     Stairs Stairs: Yes Stairs assistance: Min guard Stair Management: Two rails;Forwards;Alternating pattern Number of Stairs: 5 General stair comments: able to do correct technique after instruction  Wheelchair Mobility    Modified Rankin  (Stroke Patients Only)       Balance Overall balance assessment: Needs assistance Sitting-balance support: No upper extremity supported;Feet supported Sitting balance-Leahy Scale: Good     Standing balance support: No upper extremity supported;During functional activity Standing balance-Leahy Scale: Fair Standing balance comment: Pt with bilateral knee buckling while completing grooming tasks at sink. No overt LOB                    Cognition Arousal/Alertness: Awake/alert Behavior During Therapy: WFL for tasks assessed/performed Overall Cognitive Status: Within Functional Limits for tasks assessed                      Exercises      General Comments General comments (skin integrity, edema, etc.): Pt states she feels ready for DC home      Pertinent Vitals/Pain Pain Assessment: 0-10 Pain Score: 4  Pain Location: back Pain Descriptors / Indicators: Sore Pain Intervention(s): Limited activity within patient's tolerance;Monitored during session;Repositioned    Home Living                      Prior Function            PT Goals (current goals can now be found in the care plan section) Acute Rehab PT Goals Patient Stated Goal: to go home Progress towards PT goals: Progressing toward goals    Frequency       PT Plan Current plan remains appropriate;Other (comment) (Anticip;ate DC home today)    Co-evaluation             End of Session Equipment Utilized During Treatment: Back brace;Gait belt Activity Tolerance: Patient tolerated treatment well;No increased pain Patient left: in bed;with call bell/phone within reach;with nursing/sitter  in room     Time: 1100-1113 PT Time Calculation (min) (ACUTE ONLY): 13 min  Charges:  $Gait Training: 8-22 mins                    G Codes:      Donnella Sham 27-Jul-2015, 11:48 AM Lavona Mound, PT  [phone] 2015-07-27

## 2015-07-10 NOTE — Care Management Note (Signed)
Case Management Note  Patient Details  Name: [name] MRN: [id number] Date of Birth: November 04, 1945  Subjective/Objective:                    Action/Plan: Patient discharging home today with self care. Patient ordered rolling walker. Jermaine with [facility] DME notified and is going to deliver the walker to the room. Bedside RN updated.   Expected Discharge Date:                  Expected Discharge Plan:  Home/Self Care  In-House Referral:     Discharge planning Services     Post Acute Care Choice:  Durable Medical Equipment Choice offered to:  Patient  DME Arranged:  Walker rolling DME Agency:  [facility]  HH Arranged:    HH Agency:     Status of Service:  Completed, signed off  Medicare Important Message Given:    Date Medicare IM Given:    Medicare IM give by:    Date Additional Medicare IM Given:    Additional Medicare Important Message give by:     If discussed at Long Length of Stay Meetings, dates discussed:    Additional Comments:  Kermit BaloKelli F Alexandria Shiflett, RN 07/10/2015, 10:00 AM

## 2015-07-10 NOTE — Progress Notes (Signed)
    Subjective: Procedure(s) (LRB): L1 KYPHOPLASTY (N/A) L5 - S1 TRANSFORAMINAL LUMBAR INTERBODY FUSION (TLIF) WITH PEDICLE SCREW FIXATION 1 LEVEL (N/A) 2 Days Post-Op  Patient reports pain as 3 on 0-10 scale.  Reports decreased leg pain reports incisional back pain   Positive void Positive bowel movement Positive flatus Negative chest pain or shortness of breath  Objective: Vital signs in last 24 hours: Temp:  [98.2 F (36.8 C)-98.8 F (37.1 C)] 98.4 F (36.9 C) (12/09 0518) Pulse Rate:  [59-75] 59 (12/09 0518) Resp:  [18-19] 18 (12/09 0518) BP: (97-114)/(45-56) 114/56 mmHg (12/09 0518) SpO2:  [94 %-100 %] 96 % (12/09 0518)  Intake/Output from previous day: 12/08 0701 - 12/09 0700 In: 820 [P.O.:720; IV Piggyback:100] Out: -   Labs: No results for input(s): WBC, RBC, HCT, PLT in the last 72 hours. No results for input(s): NA, K, CL, CO2, BUN, CREATININE, GLUCOSE, CALCIUM in the last 72 hours. No results for input(s): LABPT, INR in the last 72 hours.  Physical Exam: Neurologically intact Neurovascular intact Intact pulses distally Dorsiflexion/Plantar flexion intact Incision: dressing C/D/I Compartment soft  Assessment/Plan: Patient stable  xrays satisfactoy Continue mobilization with physical therapy Continue care  Advance diet Up with therapy  Plan on d/c to home after session with PT this AM Doing well    Venita Lickahari Liesa Tsan, MD [facility] [phone]

## 2015-07-14 NOTE — Discharge Summary (Signed)
Physician Discharge Summary  Patient ID: [name] MRN: [id number] DOB/AGE: 1945-09-03 69 y.o.  Admit date: 07/08/2015 Discharge date: 07/14/2015  Admission Diagnoses:  L1 compression fracture Degenerative Disc Disease L5-S1  Discharge Diagnoses:  Active Problems:   Back pain   Past Medical History  Diagnosis Date  . Seasonal allergies   . GERD (gastroesophageal reflux disease)     tums prn  . Arthritis     Hands and elbows  . Medical history non-contributory     Surgeries: Procedure(s): L1 KYPHOPLASTY L5 - S1 TRANSFORAMINAL LUMBAR INTERBODY FUSION (TLIF) WITH PEDICLE SCREW FIXATION 1 LEVEL on 07/08/2015   Consultants (if any):    Discharged Condition: Improved  Hospital Course: [name] is an 69 y.o. female who was admitted 07/08/2015 with a diagnosis of LBP and leg pain and went to the operating room on 07/08/2015 and underwent the above named procedures.  The pt was discharged on 07/10/15. Hospital course was uneventful.   She was given perioperative antibiotics:  Anti-infectives    Start     Dose/Rate Route Frequency Ordered Stop   07/09/15 0300  ceFAZolin (ANCEF) IVPB 1 g/50 mL premix     1 g 100 mL/hr over 30 Minutes Intravenous Every 8 hours 07/08/15 1855 07/09/15 1039   07/08/15 1000  ceFAZolin (ANCEF) IVPB 2 g/50 mL premix     2 g 100 mL/hr over 30 Minutes Intravenous To ShortStay Surgical 07/07/15 0750 07/08/15 1704    .  She was given sequential compression devices, early ambulation, and TED for DVT prophylaxis.  She benefited maximally from the hospital stay and there were no complications.    Recent vital signs:  Filed Vitals:   07/10/15 0518 07/10/15 0930  BP: 114/56 102/45  Pulse: 59 74  Temp: 98.4 F (36.9 C) 98.6 F (37 C)  Resp: 18 19    Recent laboratory studies:  Lab Results  Component Value Date   HGB 13.9 07/06/2015   HGB 14.0 06/02/2015   Lab Results  Component Value Date   WBC 6.2 07/06/2015   PLT 179 07/06/2015    No results found for: INR Lab Results  Component Value Date   NA 141 06/02/2015   K 4.5 06/02/2015   CL 106 06/02/2015   CO2 30 06/02/2015   BUN 13 06/02/2015   CREATININE 0.78 06/02/2015   GLUCOSE 104* 06/02/2015    Discharge Medications:     Medication List    STOP taking these medications        ADVIL 200 MG tablet  Generic drug:  ibuprofen     ADVIL PO      TAKE these medications        BIOFLEX PO  Take 1 capsule by mouth daily.     methocarbamol 500 MG tablet  Commonly known as:  ROBAXIN  Take 1 tablet (500 mg total) by mouth 3 (three) times daily as needed for muscle spasms.     ondansetron 4 MG tablet  Commonly known as:  ZOFRAN  Take 1 tablet (4 mg total) by mouth every 8 (eight) hours as needed for nausea or vomiting.     oxyCODONE-acetaminophen 10-325 MG tablet  Commonly known as:  PERCOCET  Take 1 tablet by mouth every 4 (four) hours as needed for pain.        Diagnostic Studies: Dg Lumbar Spine 2-3 Views  07/09/2015  CLINICAL DATA:  Postoperative pain.  L1 vertebral augmentation. EXAM: LUMBAR SPINE - 2-3 VIEW COMPARISON:  Intraoperative  views from the same day. FINDINGS: PLIF is noted at L5-S1.  The hardware is intact. Vertebral augmentation is present at L1. There is extrusion of methylmethacrylate into paraspinal veins as seen on the intraoperative films. This is unlikely to be of consequence to the patient. There is a small amount of methylmethacrylate extending posteriorly within the basivertebral vein, potentially into the ventral epidural space. IMPRESSION: 1. Minimal methylmethacrylate along the basivertebral vein at L1 potentially extends into the ventral epidural space. CT could be utilized for more specific identification of the location of this methylmethacrylate. 2. Minimal methylmethacrylate with an paravertebral veins at L1 is unlikely be of consequence to the patient. 3. L5-S1 PLIF without radiographic evidence for complication.  Electronically Signed   By: Christopher  Mattern M.D.   On: 07/09/2015 12:07   Dg Lumbar Spine Complete  12/7Marin Roberts/2016  CLINICAL DATA:  L1 kyphoplasty. L5-S1 trans foraminal lumbar interbody fusion. EXAM: DG C-ARM GT 120 MIN; LUMBAR SPINE - COMPLETE 4+ VIEW CONTRAST:  None FLUOROSCOPY TIME:  Radiation Exposure Index (as provided by the fluoroscopic device): If the device does not provide the exposure index: Fluoroscopy Time (in minutes and seconds):  5 minutes 9 seconds Number of Acquired Images:  4 COMPARISON:  None. FINDINGS: Treated compression fracture is identified at the approximate L1 level. At the L5-S1 level there are bilateral transpedicular screws and interbody fusion device. Hardware components appear to be in anatomic alignment without obvious complication. IMPRESSION: 1. Status post L1 kyphoplasty and L5-S1 PLIF. Electronically Signed   By: Signa Kellaylor  Stroud M.D.   On: 07/08/2015 17:09   Dg C-arm Gt 120 Min  07/08/2015  CLINICAL DATA:  L1 kyphoplasty. L5-S1 trans foraminal lumbar interbody fusion. EXAM: DG C-ARM GT 120 MIN; LUMBAR SPINE - COMPLETE 4+ VIEW CONTRAST:  None FLUOROSCOPY TIME:  Radiation Exposure Index (as provided by the fluoroscopic device): If the device does not provide the exposure index: Fluoroscopy Time (in minutes and seconds):  5 minutes 9 seconds Number of Acquired Images:  4 COMPARISON:  None. FINDINGS: Treated compression fracture is identified at the approximate L1 level. At the L5-S1 level there are bilateral transpedicular screws and interbody fusion device. Hardware components appear to be in anatomic alignment without obvious complication. IMPRESSION: 1. Status post L1 kyphoplasty and L5-S1 PLIF. Electronically Signed   By: Signa Kellaylor  Stroud M.D.   On: 07/08/2015 17:09    Disposition: 01-Home or Self Care        Follow-up Information    Schedule an appointment as soon as possible for a visit with Alvy BealBROOKS,DAHARI D, MD.   Specialty:  Orthopedic Surgery   Why:  If  symptoms worsen, For suture removal, For wound re-check   Contact information:   [address] [place] [address] [phone]        Signed: Kirt BoysMayo, Karmina Zufall [name] 07/14/2015, 1:20 PM

## 2015-08-26 DIAGNOSIS — M5442 Lumbago with sciatica, left side: Secondary | ICD-10-CM | POA: Diagnosis not present

## 2015-08-26 DIAGNOSIS — G8929 Other chronic pain: Secondary | ICD-10-CM | POA: Diagnosis not present

## 2015-08-28 DIAGNOSIS — M5442 Lumbago with sciatica, left side: Secondary | ICD-10-CM | POA: Diagnosis not present

## 2015-08-28 DIAGNOSIS — G8929 Other chronic pain: Secondary | ICD-10-CM | POA: Diagnosis not present

## 2015-09-02 DIAGNOSIS — G8929 Other chronic pain: Secondary | ICD-10-CM | POA: Diagnosis not present

## 2015-09-02 DIAGNOSIS — M5442 Lumbago with sciatica, left side: Secondary | ICD-10-CM | POA: Diagnosis not present

## 2015-09-04 DIAGNOSIS — G8929 Other chronic pain: Secondary | ICD-10-CM | POA: Diagnosis not present

## 2015-09-04 DIAGNOSIS — M5442 Lumbago with sciatica, left side: Secondary | ICD-10-CM | POA: Diagnosis not present

## 2015-09-09 DIAGNOSIS — M5442 Lumbago with sciatica, left side: Secondary | ICD-10-CM | POA: Diagnosis not present

## 2015-09-09 DIAGNOSIS — G8929 Other chronic pain: Secondary | ICD-10-CM | POA: Diagnosis not present

## 2015-09-11 DIAGNOSIS — G8929 Other chronic pain: Secondary | ICD-10-CM | POA: Diagnosis not present

## 2015-09-11 DIAGNOSIS — M5442 Lumbago with sciatica, left side: Secondary | ICD-10-CM | POA: Diagnosis not present

## 2015-09-16 DIAGNOSIS — G8929 Other chronic pain: Secondary | ICD-10-CM | POA: Diagnosis not present

## 2015-09-16 DIAGNOSIS — M5442 Lumbago with sciatica, left side: Secondary | ICD-10-CM | POA: Diagnosis not present

## 2015-09-21 DIAGNOSIS — G8929 Other chronic pain: Secondary | ICD-10-CM | POA: Diagnosis not present

## 2015-09-21 DIAGNOSIS — M5442 Lumbago with sciatica, left side: Secondary | ICD-10-CM | POA: Diagnosis not present

## 2015-09-23 DIAGNOSIS — M5442 Lumbago with sciatica, left side: Secondary | ICD-10-CM | POA: Diagnosis not present

## 2015-09-23 DIAGNOSIS — G8929 Other chronic pain: Secondary | ICD-10-CM | POA: Diagnosis not present

## 2015-12-22 DIAGNOSIS — Z9889 Other specified postprocedural states: Secondary | ICD-10-CM | POA: Diagnosis not present

## 2015-12-22 DIAGNOSIS — Z4789 Encounter for other orthopedic aftercare: Secondary | ICD-10-CM | POA: Diagnosis not present

## 2016-01-14 IMAGING — RF DG LUMBAR SPINE COMPLETE 4+V
1 series · 4 of 4 positions shown · non-contrast
Comparison: None.

CLINICAL DATA: L1 kyphoplasty. L5-S1 trans foraminal lumbar
interbody fusion.

[Series 1: run · 4 of 4 slices shown]
[im 1/4]
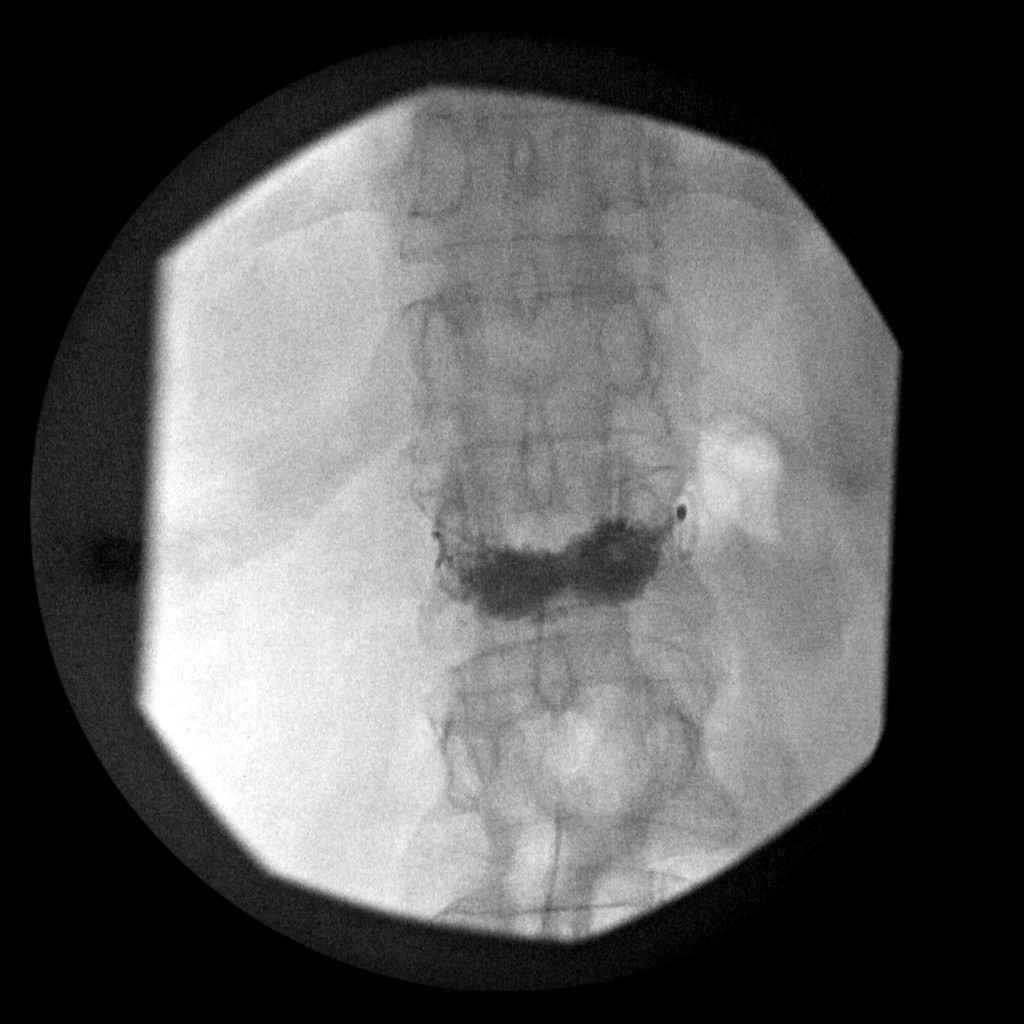
[im 2/4]
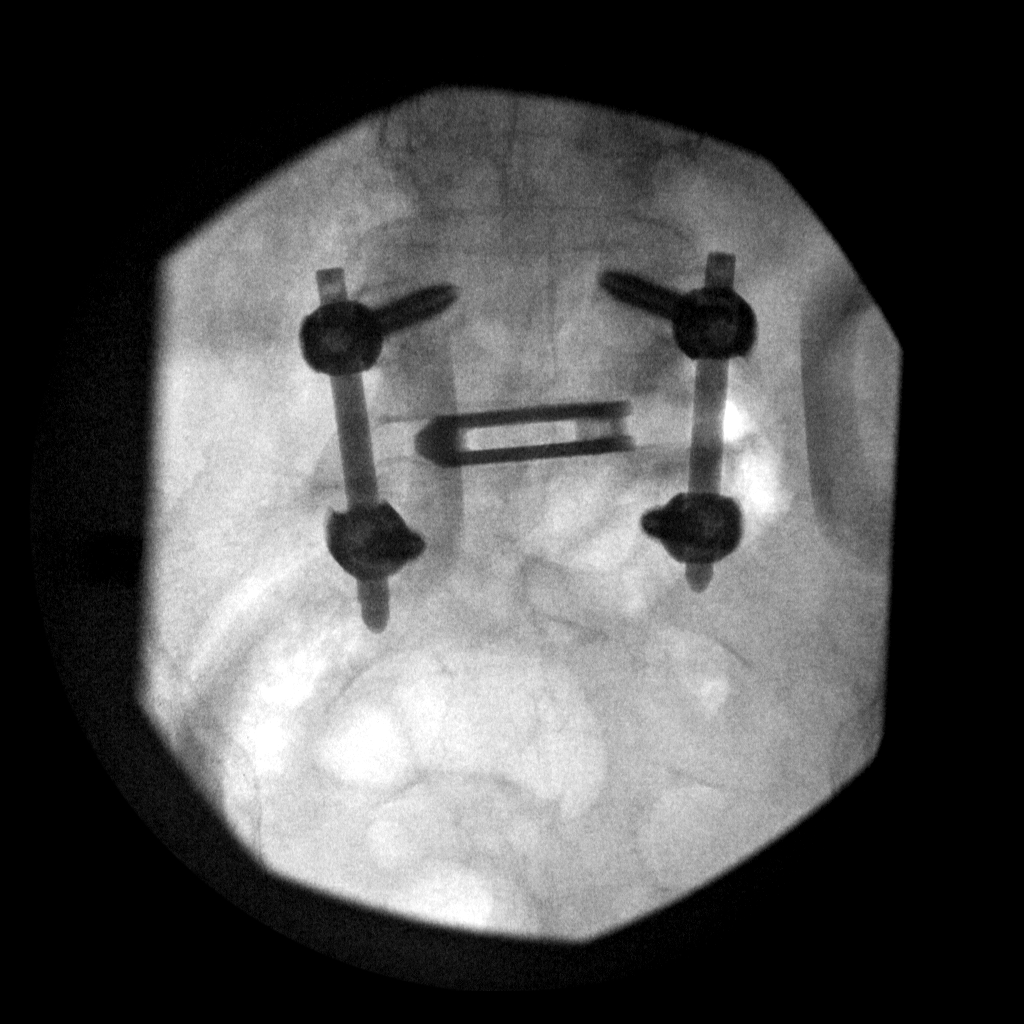
[im 3/4]
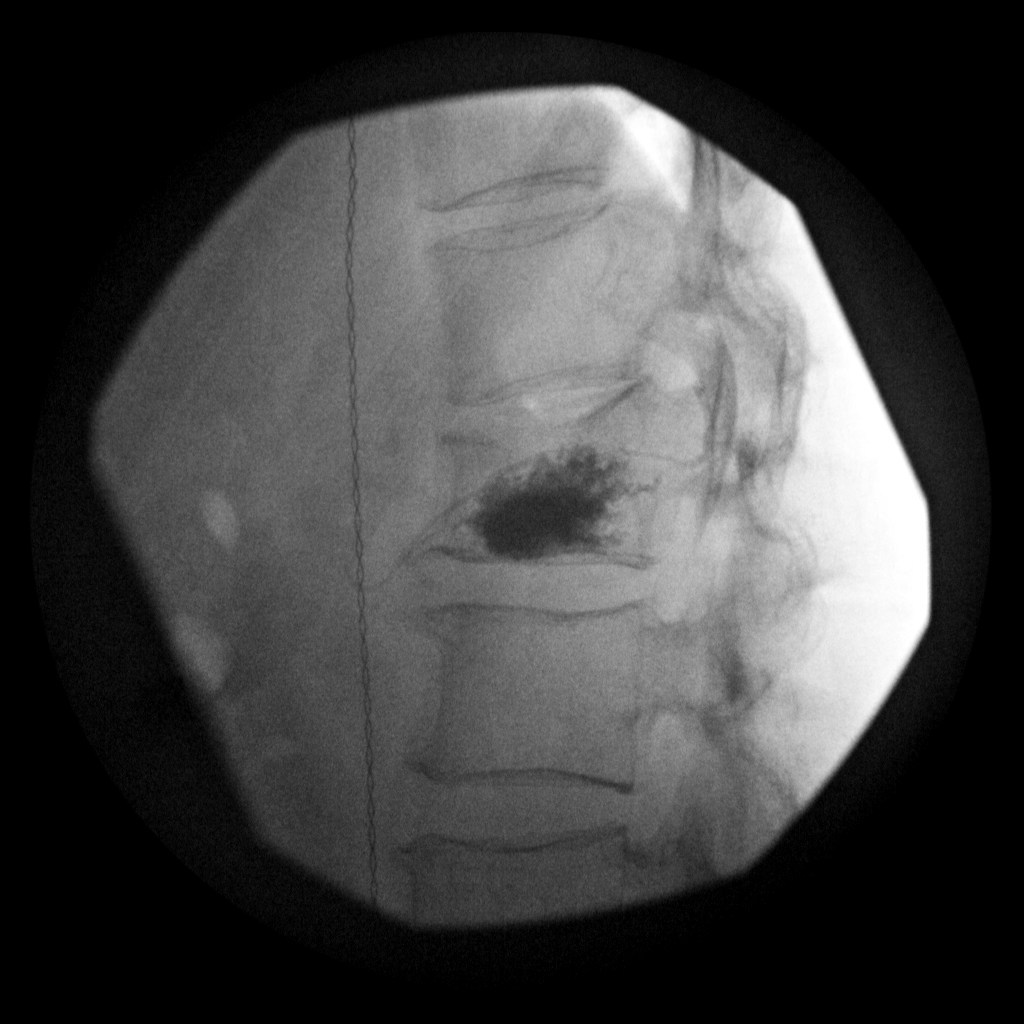
[im 4/4]
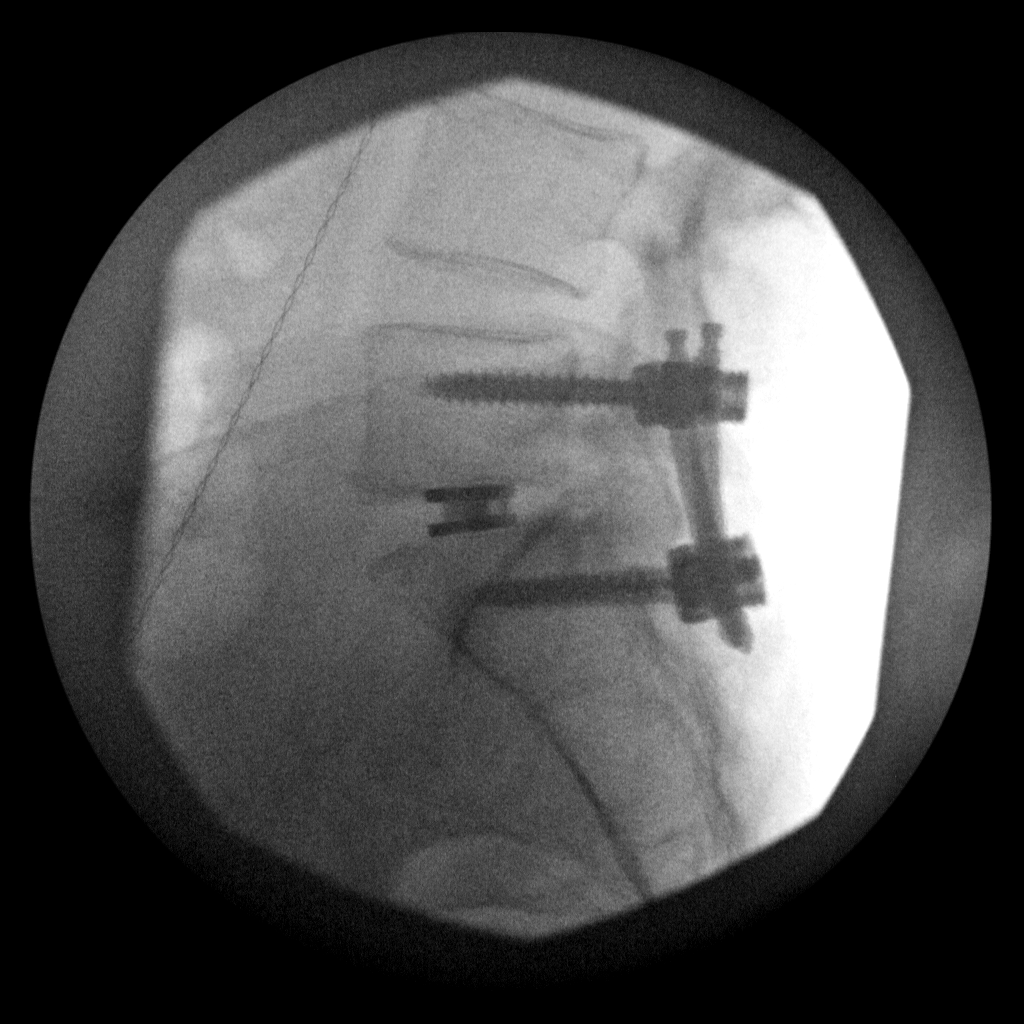

[4 of 4 positions shown; findings below may reference images not displayed]

EXAM:
DG C-ARM GT 120 MIN; LUMBAR SPINE - COMPLETE 4+ VIEW

CONTRAST:  None

FLUOROSCOPY TIME:  Radiation Exposure Index (as provided by the
fluoroscopic device):

If the device does not provide the exposure index:

Fluoroscopy Time (in minutes and seconds):  5 minutes 9 seconds

Number of Acquired Images:  4
FINDINGS: Treated compression fracture is identified at the approximate L1
level.

At the L5-S1 level there are bilateral transpedicular screws and
interbody fusion device. Hardware components appear to be in
anatomic alignment without obvious complication.
IMPRESSION: 1. Status post L1 kyphoplasty and L5-S1 PLIF.

## 2016-01-15 IMAGING — DX DG LUMBAR SPINE 2-3V
2 series · 2 of 2 positions shown · non-contrast
Comparison: Intraoperative views from the same day.

CLINICAL DATA: Postoperative pain.  L1 vertebral augmentation.

EXAM:
LUMBAR SPINE - 2-3 VIEW

[t lumbar spine ap]
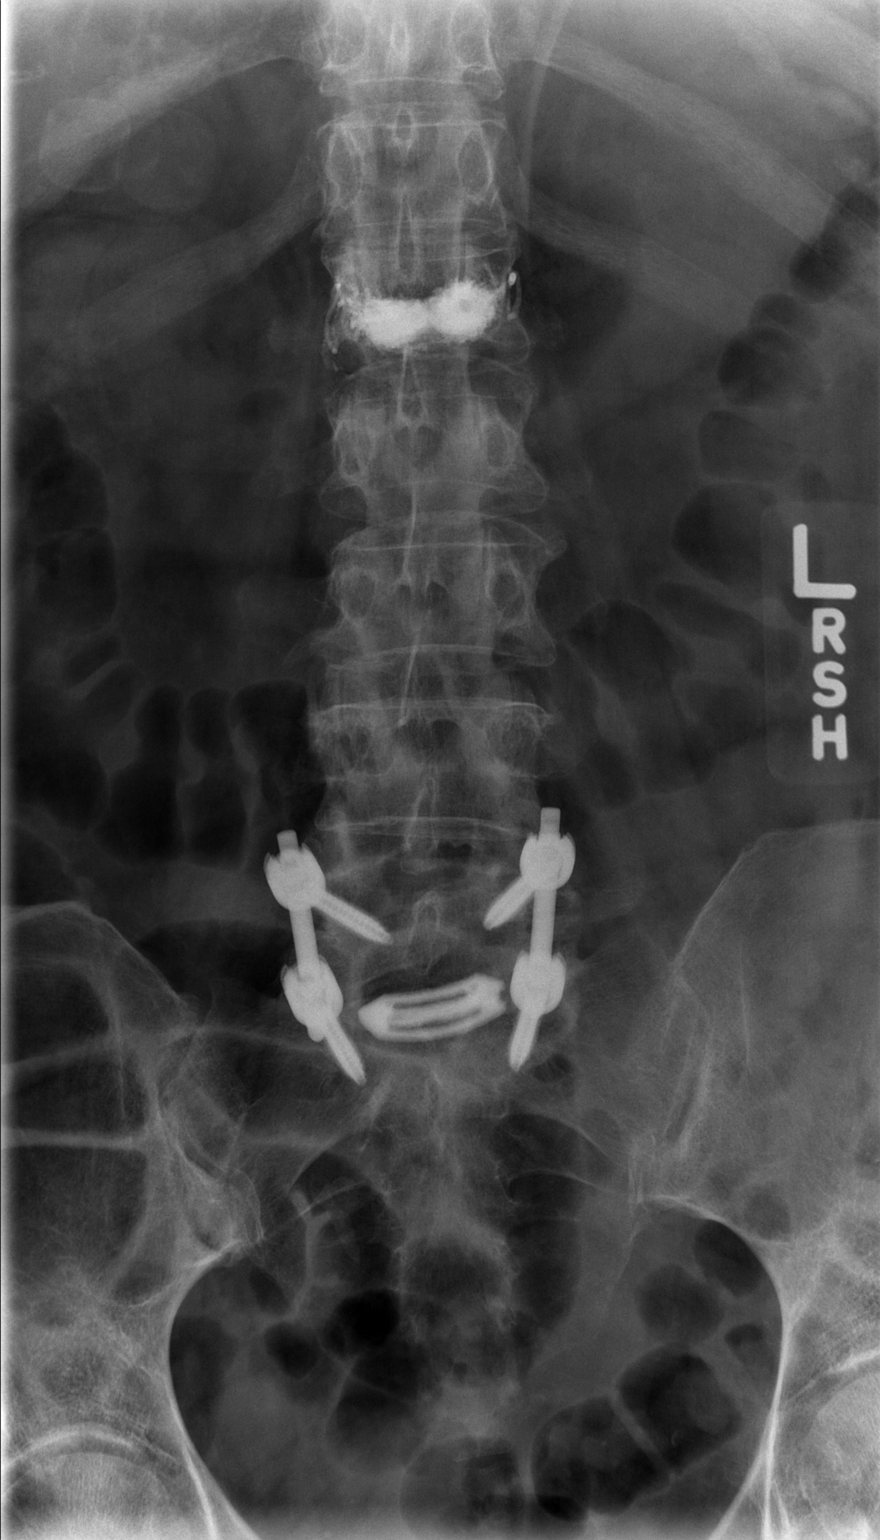

[t lumbar spine lat]
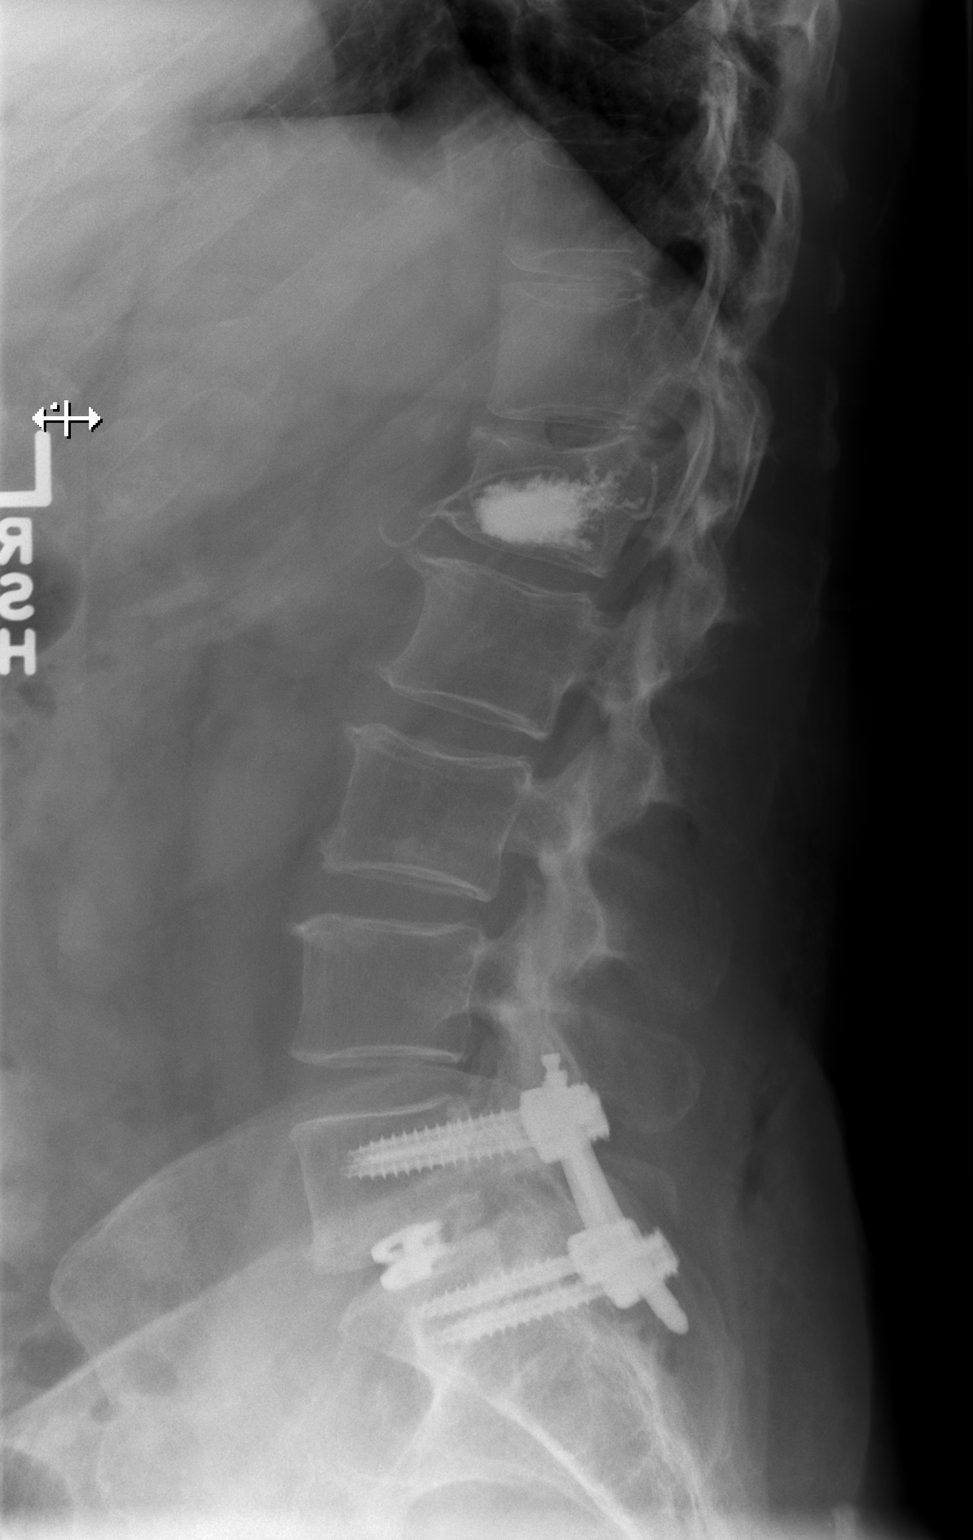

[2 of 2 positions shown; findings below may reference images not displayed]

FINDINGS: PLIF is noted at L5-S1.  The hardware is intact.

Vertebral augmentation is present at L1. There is extrusion of
methylmethacrylate into paraspinal veins as seen on the
intraoperative films. This is unlikely to be of consequence to the
patient. There is a small amount of methylmethacrylate extending
posteriorly within the basivertebral vein, potentially into the
ventral epidural space.
IMPRESSION: 1. Minimal methylmethacrylate along the basivertebral vein at L1
potentially extends into the ventral epidural space. CT could be
utilized for more specific identification of the location of this
methylmethacrylate.
2. Minimal methylmethacrylate with an paravertebral veins at L1 is
unlikely be of consequence to the patient.
3. L5-S1 PLIF without radiographic evidence for complication.

## 2016-07-14 ENCOUNTER — Ambulatory Visit (INDEPENDENT_AMBULATORY_CARE_PROVIDER_SITE_OTHER): Payer: BLUE CROSS/BLUE SHIELD | Admitting: Internal Medicine

## 2016-07-14 ENCOUNTER — Encounter: Payer: Self-pay | Admitting: Internal Medicine

## 2016-07-14 ENCOUNTER — Other Ambulatory Visit (INDEPENDENT_AMBULATORY_CARE_PROVIDER_SITE_OTHER): Payer: BLUE CROSS/BLUE SHIELD

## 2016-07-14 VITALS — BP 154/92 | HR 61 | Temp 98.0°F | Resp 16 | Ht 68.0 in | Wt 213.0 lb

## 2016-07-14 DIAGNOSIS — Z0000 Encounter for general adult medical examination without abnormal findings: Secondary | ICD-10-CM

## 2016-07-14 DIAGNOSIS — M5416 Radiculopathy, lumbar region: Secondary | ICD-10-CM | POA: Diagnosis not present

## 2016-07-14 DIAGNOSIS — S32010D Wedge compression fracture of first lumbar vertebra, subsequent encounter for fracture with routine healing: Secondary | ICD-10-CM | POA: Diagnosis not present

## 2016-07-14 DIAGNOSIS — S32010A Wedge compression fracture of first lumbar vertebra, initial encounter for closed fracture: Secondary | ICD-10-CM | POA: Insufficient documentation

## 2016-07-14 DIAGNOSIS — Z1159 Encounter for screening for other viral diseases: Secondary | ICD-10-CM

## 2016-07-14 DIAGNOSIS — Z23 Encounter for immunization: Secondary | ICD-10-CM

## 2016-07-14 DIAGNOSIS — Z1211 Encounter for screening for malignant neoplasm of colon: Secondary | ICD-10-CM | POA: Diagnosis not present

## 2016-07-14 DIAGNOSIS — R2681 Unsteadiness on feet: Secondary | ICD-10-CM

## 2016-07-14 DIAGNOSIS — Z13820 Encounter for screening for osteoporosis: Secondary | ICD-10-CM

## 2016-07-14 LAB — LIPID PANEL
CHOLESTEROL: 179 mg/dL (ref 0–200)
HDL: 47.9 mg/dL (ref >39.00)
LDL CALC: 100 mg/dL — AB (ref 0–99)
NonHDL: 131.5
TRIGLYCERIDES: 157 mg/dL — AB (ref 0.0–149.0)
Total CHOL/HDL Ratio: 4
VLDL: 31.4 mg/dL (ref 0.0–40.0)

## 2016-07-14 LAB — COMPREHENSIVE METABOLIC PANEL
ALBUMIN: 4 g/dL (ref 3.5–5.2)
ALK PHOS: 88 U/L (ref 39–117)
ALT: 16 U/L (ref 0–35)
AST: 24 U/L (ref 0–37)
BUN: 12 mg/dL (ref 6–23)
CALCIUM: 9.3 mg/dL (ref 8.4–10.5)
CHLORIDE: 106 meq/L (ref 96–112)
CO2: 30 mEq/L (ref 19–32)
CREATININE: 0.68 mg/dL (ref 0.40–1.20)
GFR: 91.09 mL/min (ref >60.00)
Glucose, Bld: 97 mg/dL (ref 70–99)
POTASSIUM: 4.3 meq/L (ref 3.5–5.1)
Sodium: 141 mEq/L (ref 135–145)
Total Bilirubin: 0.6 mg/dL (ref 0.2–1.2)
Total Protein: 7.6 g/dL (ref 6.0–8.3)

## 2016-07-14 LAB — VITAMIN D 25 HYDROXY (VIT D DEFICIENCY, FRACTURES): VITD: 23.94 ng/mL — AB (ref 30.00–100.00)

## 2016-07-14 LAB — TSH: TSH: 1.22 u[IU]/mL (ref 0.35–4.50)

## 2016-07-14 LAB — HEPATITIS C ANTIBODY: HCV Ab: NEGATIVE

## 2016-07-14 NOTE — Progress Notes (Signed)
Subjective:    Patient ID: [name], female    DOB: Dec 12, 1945, 70 y.o.   MRN: [id number]  HPI She is here for a physical exam.   She had back surgery since she was here last.  She is dong PT exercises, but no other exercise.   She feels well and has no concerns.   Medications and allergies reviewed with patient and updated if appropriate.  Patient Active Problem List   Diagnosis Date Noted  . Closed compression fracture of L1 lumbar vertebra (HCC) 07/14/2016  . Gait instability 07/14/2016  . Back pain 07/08/2015  . Allergic rhinitis 06/02/2015  . Lumbar radiculopathy 06/02/2015    Current Outpatient Prescriptions on File Prior to Visit  Medication Sig Dispense Refill  . Bioflavonoid Products (BIOFLEX PO) Take 1 capsule by mouth daily.      No current facility-administered medications on file prior to visit.     Past Medical History:  Diagnosis Date  . Arthritis    Hands and elbows  . GERD (gastroesophageal reflux disease)    tums prn  . Medical history non-contributory   . Seasonal allergies     Past Surgical History:  Procedure Laterality Date  . KYPHOPLASTY N/A 07/08/2015   Procedure: L1 KYPHOPLASTY;  Surgeon: Venita Lickahari Brooks, MD;  Location: [facility] OR;  Service: Orthopedics;  Laterality: N/A;  . ORIF ANKLE FRACTURE Right 2003  . TRANSFORAMINAL LUMBAR INTERBODY FUSION (TLIF) WITH PEDICLE SCREW FIXATION 1 LEVEL N/A 07/08/2015   Procedure: L5 - S1 TRANSFORAMINAL LUMBAR INTERBODY FUSION (TLIF) WITH PEDICLE SCREW FIXATION 1 LEVEL;  Surgeon: Venita Lickahari Brooks, MD;  Location: [facility] OR;  Service: Orthopedics;  Laterality: N/A;    Social History   Social History  . Marital status: Married    Spouse name: N/A  . Number of children: N/A  . Years of education: N/A   Social History Main Topics  . Smoking status: Never Smoker  . Smokeless tobacco: Never Used  . Alcohol use No  . Drug use: No  . Sexual activity: Not Asked   Other Topics Concern  . None   Social  History Narrative  . None    Family History  Problem Relation Age of Onset  . Diabetes Mother   . Hypertension Mother   . Muscular dystrophy Father   . Hypotension Father   . Diabetes Maternal Grandmother     Review of Systems  Constitutional: Negative for appetite change, chills, fatigue and fever.  HENT: Negative for hearing loss.   Eyes: Negative for visual disturbance.  Respiratory: Positive for cough (dry, allergy related). Negative for shortness of breath and wheezing.   Cardiovascular: Negative for chest pain, palpitations and leg swelling.  Gastrointestinal: Negative for abdominal pain, blood in stool, constipation, diarrhea and nausea.       No gerd  Genitourinary: Negative for dysuria and hematuria.  Musculoskeletal: Positive for arthralgias (arthritis) and back pain.  Skin: Negative for color change and rash.  Neurological: Positive for numbness (left foot - lumbar radiculopathy). Negative for dizziness, light-headedness and headaches.  Psychiatric/Behavioral: Negative for dysphoric mood. The patient is not nervous/anxious.        Objective:   Vitals:   07/14/16 0942  BP: (!) 154/92  Pulse: 61  Resp: 16  Temp: 98 F (36.7 C)   Filed Weights   07/14/16 0942  Weight: 213 lb (96.6 kg)   Body mass index is 32.39 kg/m.   Physical Exam Constitutional: She appears well-developed and well-nourished. No distress.  HENT:  Head: Normocephalic and atraumatic.  Right Ear: External ear normal. Normal ear canal and TM Left Ear: External ear normal.  Normal ear canal and TM Mouth/Throat: Oropharynx is clear and moist.  Eyes: Conjunctivae and EOM are normal.  Neck: Neck supple. No tracheal deviation present. No thyromegaly present.  No carotid bruit  Cardiovascular: Normal rate, regular rhythm and normal heart sounds.   No murmur heard.  No edema. Pulmonary/Chest: Effort normal and breath sounds normal. No respiratory distress. She has no wheezes. She has no rales.   Breast: deferred to Gyn - will schedule Abdominal: Soft. She exhibits no distension. There is no tenderness.  Msk: kyphosis, unable to stand straight due to kyphosis and likely back weakness, slight limp with left leg Lymphadenopathy: She has no cervical adenopathy.  Skin: Skin is warm and dry. She is not diaphoretic.  Psychiatric: She has a normal mood and affect. Her behavior is normal.        Assessment & Plan:   Physical exam: Screening blood work   ordered Immunizations  Tdap, pneumovax today Colonoscopy - referred today Mammogram  - due -- will schedule Gyn  - last visit a couple of years ago  - will go this year Dexa  - ordered - last one was 5 years ago Eye exams -  Up to date  -- will schedule, due EKG - done 2016, not needed Exercise - doing PT exercises, but no other exercise.  Will try water exercise Weight -  Obese - advised weight loss Skin   - no concerns Substance abuse  none  See Problem List for Assessment and Plan of chronic medical problems.  F/u annually

## 2016-07-14 NOTE — Assessment & Plan Note (Addendum)
Walks with a cane Unable to stand up straight Left leg nerve pain, leg foot numbness Pain in lower back with too much exertion

## 2016-07-14 NOTE — Progress Notes (Signed)
Pre visit review using our clinic review tool, if applicable. No additional management support is needed unless otherwise documented below in the visit note. 

## 2016-07-14 NOTE — Patient Instructions (Addendum)
Call and schedule a mammogram - [facility] - Call [phone]  Start vitamin D 2000 units a day.  Start calcium 600 mg twice daily with food.     Test(s) ordered today. Your results will be released to [place] (or called to you) after review, usually within 72hours after test completion. If any changes need to be made, you will be notified at that same time.  All other Health Maintenance issues reviewed.   All recommended immunizations and age-appropriate screenings are up-to-date or discussed.  Pneumovax and Tetanus immunizations administered today.   Medications reviewed and updated.  Changes include starting calcium and vitamin d.   A referral was ordered for GI for a colonoscopy  Please followup in one year for a physical   Health Maintenance, Female Introduction Adopting a healthy lifestyle and getting preventive care can go a long way to promote health and wellness. Talk with your health care provider about what schedule of regular examinations is right for you. This is a good chance for you to check in with your provider about disease prevention and staying healthy. In between checkups, there are plenty of things you can do on your own. Experts have done a lot of research about which lifestyle changes and preventive measures are most likely to keep you healthy. Ask your health care provider for more information. Weight and diet Eat a healthy diet  Be sure to include plenty of vegetables, fruits, low-fat dairy products, and lean protein.  Do not eat a lot of foods high in solid fats, added sugars, or salt.  Get regular exercise. This is one of the most important things you can do for your health.  Most adults should exercise for at least 150 minutes each week. The exercise should increase your heart rate and make you sweat (moderate-intensity exercise).  Most adults should also do strengthening exercises at least twice a week. This is in addition to the  moderate-intensity exercise. Maintain a healthy weight  Body mass index (BMI) is a measurement that can be used to identify possible weight problems. It estimates body fat based on height and weight. Your health care provider can help determine your BMI and help you achieve or maintain a healthy weight.  For females 54 years of age and older:  A BMI below 18.5 is considered underweight.  A BMI of 18.5 to 24.9 is normal.  A BMI of 25 to 29.9 is considered overweight.  A BMI of 30 and above is considered obese. Watch levels of cholesterol and blood lipids  You should start having your blood tested for lipids and cholesterol at 69 years of age, then have this test every 5 years.  You may need to have your cholesterol levels checked more often if:  Your lipid or cholesterol levels are high.  You are older than 68 years of age.  You are at high risk for heart disease. Cancer screening Lung Cancer  Lung cancer screening is recommended for adults 74-66 years old who are at high risk for lung cancer because of a history of smoking.  A yearly low-dose CT scan of the lungs is recommended for people who:  Currently smoke.  Have quit within the past 15 years.  Have at least a 30-pack-year history of smoking. A pack year is smoking an average of one pack of cigarettes a day for 1 year.  Yearly screening should continue until it has been 15 years since you quit.  Yearly screening should stop if you  develop a health problem that would prevent you from having lung cancer treatment. Breast Cancer  Practice breast self-awareness. This means understanding how your breasts normally appear and feel.  It also means doing regular breast self-exams. Let your health care provider know about any changes, no matter how small.  If you are in your 20s or 30s, you should have a clinical breast exam (CBE) by a health care provider every 1-3 years as part of a regular health exam.  If you are 43 or  older, have a CBE every year. Also consider having a breast X-ray (mammogram) every year.  If you have a family history of breast cancer, talk to your health care provider about genetic screening.  If you are at high risk for breast cancer, talk to your health care provider about having an MRI and a mammogram every year.  Breast cancer gene (BRCA) assessment is recommended for women who have family members with BRCA-related cancers. BRCA-related cancers include:  Breast.  Ovarian.  Tubal.  Peritoneal cancers.  Results of the assessment will determine the need for genetic counseling and BRCA1 and BRCA2 testing. Cervical Cancer  Your health care provider may recommend that you be screened regularly for cancer of the pelvic organs (ovaries, uterus, and vagina). This screening involves a pelvic examination, including checking for microscopic changes to the surface of your cervix (Pap test). You may be encouraged to have this screening done every 3 years, beginning at age 101.  For women ages 69-65, health care providers may recommend pelvic exams and Pap testing every 3 years, or they may recommend the Pap and pelvic exam, combined with testing for human papilloma virus (HPV), every 5 years. Some types of HPV increase your risk of cervical cancer. Testing for HPV may also be done on women of any age with unclear Pap test results.  Other health care providers may not recommend any screening for nonpregnant women who are considered low risk for pelvic cancer and who do not have symptoms. Ask your health care provider if a screening pelvic exam is right for you.  If you have had past treatment for cervical cancer or a condition that could lead to cancer, you need Pap tests and screening for cancer for at least 20 years after your treatment. If Pap tests have been discontinued, your risk factors (such as having a new sexual partner) need to be reassessed to determine if screening should resume. Some  women have medical problems that increase the chance of getting cervical cancer. In these cases, your health care provider may recommend more frequent screening and Pap tests. Colorectal Cancer  This type of cancer can be detected and often prevented.  Routine colorectal cancer screening usually begins at 69 years of age and continues through 69 years of age.  Your health care provider may recommend screening at an earlier age if you have risk factors for colon cancer.  Your health care provider may also recommend using home test kits to check for hidden blood in the stool.  A small camera at the end of a tube can be used to examine your colon directly (sigmoidoscopy or colonoscopy). This is done to check for the earliest forms of colorectal cancer.  Routine screening usually begins at age 6.  Direct examination of the colon should be repeated every 5-10 years through 69 years of age. However, you may need to be screened more often if early forms of precancerous polyps or small growths are found. Skin Cancer  Check your skin from head to toe regularly.  Tell your health care provider about any new moles or changes in moles, especially if there is a change in a mole's shape or color.  Also tell your health care provider if you have a mole that is larger than the size of a pencil eraser.  Always use sunscreen. Apply sunscreen liberally and repeatedly throughout the day.  Protect yourself by wearing long sleeves, pants, a wide-brimmed hat, and sunglasses whenever you are outside. Heart disease, diabetes, and high blood pressure  High blood pressure causes heart disease and increases the risk of stroke. High blood pressure is more likely to develop in:  People who have blood pressure in the high end of the normal range (130-139/85-89 mm Hg).  People who are overweight or obese.  People who are African American.  If you are 69-47 years of age, have your blood pressure checked every  3-5 years. If you are 98 years of age or older, have your blood pressure checked every year. You should have your blood pressure measured twice-once when you are at a hospital or clinic, and once when you are not at a hospital or clinic. Record the average of the two measurements. To check your blood pressure when you are not at a hospital or clinic, you can use:  An automated blood pressure machine at a pharmacy.  A home blood pressure monitor.  If you are between 79 years and 52 years old, ask your health care provider if you should take aspirin to prevent strokes.  Have regular diabetes screenings. This involves taking a blood sample to check your fasting blood sugar level.  If you are at a normal weight and have a low risk for diabetes, have this test once every three years after 69 years of age.  If you are overweight and have a high risk for diabetes, consider being tested at a younger age or more often. Preventing infection Hepatitis B  If you have a higher risk for hepatitis B, you should be screened for this virus. You are considered at high risk for hepatitis B if:  You were born in a country where hepatitis B is common. Ask your health care provider which countries are considered high risk.  Your parents were born in a high-risk country, and you have not been immunized against hepatitis B (hepatitis B vaccine).  You have HIV or AIDS.  You use needles to inject street drugs.  You live with someone who has hepatitis B.  You have had sex with someone who has hepatitis B.  You get hemodialysis treatment.  You take certain medicines for conditions, including cancer, organ transplantation, and autoimmune conditions. Hepatitis C  Blood testing is recommended for:  Everyone born from [birth year] through 1964.  Anyone with known risk factors for hepatitis C. Sexually transmitted infections (STIs)  You should be screened for sexually transmitted infections (STIs) including  gonorrhea and chlamydia if:  You are sexually active and are younger than 69 years of age.  You are older than 69 years of age and your health care provider tells you that you are at risk for this type of infection.  Your sexual activity has changed since you were last screened and you are at an increased risk for chlamydia or gonorrhea. Ask your health care provider if you are at risk.  If you do not have HIV, but are at risk, it may be recommended that you take a prescription medicine daily to  prevent HIV infection. This is called pre-exposure prophylaxis (PrEP). You are considered at risk if:  You are sexually active and do not regularly use condoms or know the HIV status of your partner(s).  You take drugs by injection.  You are sexually active with a partner who has HIV. Talk with your health care provider about whether you are at high risk of being infected with HIV. If you choose to begin PrEP, you should first be tested for HIV. You should then be tested every 3 months for as long as you are taking PrEP. Pregnancy  If you are premenopausal and you may become pregnant, ask your health care provider about preconception counseling.  If you may become pregnant, take 400 to 800 micrograms (mcg) of folic acid every day.  If you want to prevent pregnancy, talk to your health care provider about birth control (contraception). Osteoporosis and menopause  Osteoporosis is a disease in which the bones lose minerals and strength with aging. This can result in serious bone fractures. Your risk for osteoporosis can be identified using a bone density scan.  If you are 14 years of age or older, or if you are at risk for osteoporosis and fractures, ask your health care provider if you should be screened.  Ask your health care provider whether you should take a calcium or vitamin D supplement to lower your risk for osteoporosis.  Menopause may have certain physical symptoms and risks.  Hormone  replacement therapy may reduce some of these symptoms and risks. Talk to your health care provider about whether hormone replacement therapy is right for you. Follow these instructions at home:  Schedule regular health, dental, and eye exams.  Stay current with your immunizations.  Do not use any tobacco products including cigarettes, chewing tobacco, or electronic cigarettes.  If you are pregnant, do not drink alcohol.  If you are breastfeeding, limit how much and how often you drink alcohol.  Limit alcohol intake to no more than 1 drink per day for nonpregnant women. One drink equals 12 ounces of beer, 5 ounces of wine, or 1 ounces of hard liquor.  Do not use street drugs.  Do not share needles.  Ask your health care provider for help if you need support or information about quitting drugs.  Tell your health care provider if you often feel depressed.  Tell your health care provider if you have ever been abused or do not feel safe at home. This information is not intended to replace advice given to you by your health care provider. Make sure you discuss any questions you have with your health care provider. Document Released: 01/31/2011 Document Revised: 12/24/2015 Document Reviewed: 04/21/2015  2017 [employer]vier

## 2016-07-14 NOTE — Assessment & Plan Note (Signed)
Occurred with a fall dexa due - will order to r/o osteoporosis Start calcium/vit d Check vitamin d level Stressed regular exercise/balance exercises Discussed PT - it would benefit her  - she will think about it

## 2016-07-14 NOTE — Assessment & Plan Note (Addendum)
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

## 2016-07-15 LAB — CBC WITH DIFFERENTIAL/PLATELET
BASOS PCT: 0.4 % (ref 0.0–3.0)
Basophils Absolute: 0 10*3/uL (ref 0.0–0.1)
EOS PCT: 0.9 % (ref 0.0–5.0)
Eosinophils Absolute: 0.1 10*3/uL (ref 0.0–0.7)
HEMATOCRIT: 42.2 % (ref 36.0–46.0)
HEMOGLOBIN: 14.3 g/dL (ref 12.0–15.0)
LYMPHS PCT: 33.6 % (ref 12.0–46.0)
Lymphs Abs: 2.3 10*3/uL (ref 0.7–4.0)
MCHC: 33.8 g/dL (ref 30.0–36.0)
MCV: 85.8 fl (ref 78.0–100.0)
MONOS PCT: 5 % (ref 3.0–12.0)
Monocytes Absolute: 0.3 10*3/uL (ref 0.1–1.0)
Neutro Abs: 4.1 10*3/uL (ref 1.4–7.7)
Neutrophils Relative %: 60.1 % (ref 43.0–77.0)
Platelets: 192 10*3/uL (ref 150.0–400.0)
RBC: 4.92 Mil/uL (ref 3.87–5.11)
RDW: 15.4 % (ref 11.5–15.5)
WBC: 6.8 10*3/uL (ref 4.0–10.5)

## 2016-08-15 ENCOUNTER — Encounter: Payer: Self-pay | Admitting: Internal Medicine

## 2016-08-18 ENCOUNTER — Inpatient Hospital Stay: Admission: RE | Admit: 2016-08-18 | Payer: BLUE CROSS/BLUE SHIELD | Source: Ambulatory Visit

## 2017-01-16 ENCOUNTER — Ambulatory Visit (INDEPENDENT_AMBULATORY_CARE_PROVIDER_SITE_OTHER): Payer: Medicare HMO | Admitting: Internal Medicine

## 2017-01-16 ENCOUNTER — Encounter: Payer: Self-pay | Admitting: Internal Medicine

## 2017-01-16 VITALS — BP 118/70 | HR 67 | Temp 98.3°F | Resp 16 | Ht 68.0 in | Wt 213.9 lb

## 2017-01-16 DIAGNOSIS — H60391 Other infective otitis externa, right ear: Secondary | ICD-10-CM

## 2017-01-16 DIAGNOSIS — H9311 Tinnitus, right ear: Secondary | ICD-10-CM | POA: Diagnosis not present

## 2017-01-16 DIAGNOSIS — H6090 Unspecified otitis externa, unspecified ear: Secondary | ICD-10-CM | POA: Insufficient documentation

## 2017-01-16 MED ORDER — NEOMYCIN-POLYMYXIN-HC 3.5-10000-1 OT SUSP: 4.0000 [drp] | Freq: Four times a day (QID) | OTIC | 0 refills | Status: DC

## 2017-01-16 NOTE — Progress Notes (Signed)
Subjective:  Patient ID: [name], female    DOB: Jul 24, 1946  Age: 71 y.o. MRN: [id number]  CC: Ear Pain   HPI [name] presents for a several day history of pain in the right ear. She thought there was a bug in the ear because she has heard a ringing sound in her right ear. She denies loss of hearing, dizziness, vertigo, rash, swelling, lymphadenopathy, headache, fever, chills.  Outpatient Medications Prior to Visit  Medication Sig Dispense Refill  . aspirin EC 81 MG tablet Take 81 mg by mouth daily.    Marland Kitchen. Bioflavonoid Products (BIOFLEX PO) Take 1 capsule by mouth daily.      No facility-administered medications prior to visit.     ROS Review of Systems  Constitutional: Negative.  Negative for chills and fever.  HENT: Positive for ear pain. Negative for congestion, facial swelling, sinus pressure and trouble swallowing.   Endocrine: Negative.   Musculoskeletal: Negative.   Skin: Negative.   Neurological: Negative.   Hematological: Negative.   Psychiatric/Behavioral: Negative.     Objective:  BP 118/70 (BP Location: Left Arm, Patient Position: Sitting, Cuff Size: Normal)   Pulse 67   Temp 98.3 F (36.8 C) (Oral)   Resp 16   Ht 5\' 8"  (1.727 m)   Wt 213 lb 14 oz (97 kg)   SpO2 98%   BMI 32.52 kg/m   BP Readings from Last 3 Encounters:  01/16/17 118/70  07/14/16 (!) 154/92  07/10/15 (!) 102/45    Wt Readings from Last 3 Encounters:  01/16/17 213 lb 14 oz (97 kg)  07/14/16 213 lb (96.6 kg)  07/08/15 207 lb (93.9 kg)    Physical Exam  Constitutional:  Non-toxic appearance. She does not have a sickly appearance. She does not appear ill. No distress.  HENT:  Right Ear: Hearing and tympanic membrane normal. No lacerations. There is swelling. No drainage or tenderness. No foreign bodies. No mastoid tenderness. Tympanic membrane is not injected, not scarred, not perforated, not erythematous, not retracted and not bulging. Tympanic membrane mobility is  normal. No middle ear effusion. No hemotympanum. No decreased hearing is noted.  Left Ear: Hearing, tympanic membrane, external ear and ear canal normal.  Mouth/Throat: Oropharynx is clear and moist. No oropharyngeal exudate.  The inferior floor of the right EAC shows a small area of erythema and swelling. There is no foreign body. There is no exudate.  Eyes: Conjunctivae are normal. Right eye exhibits no discharge. Left eye exhibits no discharge. No scleral icterus.  Neck: Normal range of motion. Neck supple. No JVD present. No thyromegaly present.  Pulmonary/Chest: No stridor.  Lymphadenopathy:    She has no cervical adenopathy.  Skin: She is not diaphoretic.    Lab Results  Component Value Date   WBC 6.8 07/14/2016   HGB 14.3 07/14/2016   HCT 42.2 07/14/2016   PLT 192.0 07/14/2016   GLUCOSE 97 07/14/2016   CHOL 179 07/14/2016   TRIG 157.0 (H) 07/14/2016   HDL 47.90 07/14/2016   LDLCALC 100 (H) 07/14/2016   ALT 16 07/14/2016   AST 24 07/14/2016   NA 141 07/14/2016   K 4.3 07/14/2016   CL 106 07/14/2016   CREATININE 0.68 07/14/2016   BUN 12 07/14/2016   CO2 30 07/14/2016   TSH 1.22 07/14/2016   HGBA1C 5.5 06/02/2015    No results found.  Assessment & Plan:   [name] was seen today for ear pain.  Diagnoses and all orders for  this visit:  Other infective acute otitis externa of right ear- I do not see a foreign body, will treat for inflammation and infection with Cortisporin Otic -     neomycin-polymyxin-hydrocortisone (CORTISPORIN) 3.5-10000-1 OTIC suspension; Place 4 drops into the right ear 4 (four) times daily.  Tinnitus of right ear- I've asked her to have her hearing tested to see if she has hearing loss.   I am having Ms. Rosinski start on neomycin-polymyxin-hydrocortisone. I am also having her maintain her Bioflavonoid Products (BIOFLEX PO) and aspirin EC.  Meds ordered this encounter  Medications  . neomycin-polymyxin-hydrocortisone (CORTISPORIN) 3.5-10000-1  OTIC suspension    Sig: Place 4 drops into the right ear 4 (four) times daily.    Dispense:  10 mL    Refill:  0     Follow-up: Return in about 3 weeks (around 02/06/2017).  Sanda Linger, MD

## 2017-01-16 NOTE — Patient Instructions (Signed)

## 2017-01-17 DIAGNOSIS — H9311 Tinnitus, right ear: Secondary | ICD-10-CM | POA: Insufficient documentation

## 2017-07-16 NOTE — Patient Instructions (Addendum)
Test(s) ordered today. Your results will be released to MyChart (or called to you) after review, usually within 72hours after test completion. If any changes need to be made, you will be notified at that same time.  All other Health Maintenance issues reviewed.   All recommended immunizations and age-appropriate screenings are up-to-date or discussed.  No immunizations administered today.   Medications reviewed and updated.  No changes recommended at this time.    Please followup in one year   Health Maintenance, Female Adopting a healthy lifestyle and getting preventive care can go a long way to promote health and wellness. Talk with your health care provider about what schedule of regular examinations is right for you. This is a good chance for you to check in with your provider about disease prevention and staying healthy. In between checkups, there are plenty of things you can do on your own. Experts have done a lot of research about which lifestyle changes and preventive measures are most likely to keep you healthy. Ask your health care provider for more information. Weight and diet Eat a healthy diet  Be sure to include plenty of vegetables, fruits, low-fat dairy products, and lean protein.  Do not eat a lot of foods high in solid fats, added sugars, or salt.  Get regular exercise. This is one of the most important things you can do for your health. ? Most adults should exercise for at least 150 minutes each week. The exercise should increase your heart rate and make you sweat (moderate-intensity exercise). ? Most adults should also do strengthening exercises at least twice a week. This is in addition to the moderate-intensity exercise.  Maintain a healthy weight  Body mass index (BMI) is a measurement that can be used to identify possible weight problems. It estimates body fat based on height and weight. Your health care provider can help determine your BMI and help you achieve  or maintain a healthy weight.  For females 20 years of age and older: ? A BMI below 18.5 is considered underweight. ? A BMI of 18.5 to 24.9 is normal. ? A BMI of 25 to 29.9 is considered overweight. ? A BMI of 30 and above is considered obese.  Watch levels of cholesterol and blood lipids  You should start having your blood tested for lipids and cholesterol at 70 years of age, then have this test every 5 years.  You may need to have your cholesterol levels checked more often if: ? Your lipid or cholesterol levels are high. ? You are older than 70 years of age. ? You are at high risk for heart disease.  Cancer screening Lung Cancer  Lung cancer screening is recommended for adults 55-80 years old who are at high risk for lung cancer because of a history of smoking.  A yearly low-dose CT scan of the lungs is recommended for people who: ? Currently smoke. ? Have quit within the past 15 years. ? Have at least a 30-pack-year history of smoking. A pack year is smoking an average of one pack of cigarettes a day for 1 year.  Yearly screening should continue until it has been 15 years since you quit.  Yearly screening should stop if you develop a health problem that would prevent you from having lung cancer treatment.  Breast Cancer  Practice breast self-awareness. This means understanding how your breasts normally appear and feel.  It also means doing regular breast self-exams. Let your health care provider know about any   changes, no matter how small.  If you are in your 20s or 30s, you should have a clinical breast exam (CBE) by a health care provider every 1-3 years as part of a regular health exam.  If you are 40 or older, have a CBE every year. Also consider having a breast X-ray (mammogram) every year.  If you have a family history of breast cancer, talk to your health care provider about genetic screening.  If you are at high risk for breast cancer, talk to your health care  provider about having an MRI and a mammogram every year.  Breast cancer gene (BRCA) assessment is recommended for women who have family members with BRCA-related cancers. BRCA-related cancers include: ? Breast. ? Ovarian. ? Tubal. ? Peritoneal cancers.  Results of the assessment will determine the need for genetic counseling and BRCA1 and BRCA2 testing.  Cervical Cancer Your health care provider may recommend that you be screened regularly for cancer of the pelvic organs (ovaries, uterus, and vagina). This screening involves a pelvic examination, including checking for microscopic changes to the surface of your cervix (Pap test). You may be encouraged to have this screening done every 3 years, beginning at age 21.  For women ages 30-65, health care providers may recommend pelvic exams and Pap testing every 3 years, or they may recommend the Pap and pelvic exam, combined with testing for human papilloma virus (HPV), every 5 years. Some types of HPV increase your risk of cervical cancer. Testing for HPV may also be done on women of any age with unclear Pap test results.  Other health care providers may not recommend any screening for nonpregnant women who are considered low risk for pelvic cancer and who do not have symptoms. Ask your health care provider if a screening pelvic exam is right for you.  If you have had past treatment for cervical cancer or a condition that could lead to cancer, you need Pap tests and screening for cancer for at least 20 years after your treatment. If Pap tests have been discontinued, your risk factors (such as having a new sexual partner) need to be reassessed to determine if screening should resume. Some women have medical problems that increase the chance of getting cervical cancer. In these cases, your health care provider may recommend more frequent screening and Pap tests.  Colorectal Cancer  This type of cancer can be detected and often prevented.  Routine  colorectal cancer screening usually begins at 70 years of age and continues through 70 years of age.  Your health care provider may recommend screening at an earlier age if you have risk factors for colon cancer.  Your health care provider may also recommend using home test kits to check for hidden blood in the stool.  A small camera at the end of a tube can be used to examine your colon directly (sigmoidoscopy or colonoscopy). This is done to check for the earliest forms of colorectal cancer.  Routine screening usually begins at age 50.  Direct examination of the colon should be repeated every 5-10 years through 70 years of age. However, you may need to be screened more often if early forms of precancerous polyps or small growths are found.  Skin Cancer  Check your skin from head to toe regularly.  Tell your health care provider about any new moles or changes in moles, especially if there is a change in a mole's shape or color.  Also tell your health care provider if   you have a mole that is larger than the size of a pencil eraser.  Always use sunscreen. Apply sunscreen liberally and repeatedly throughout the day.  Protect yourself by wearing long sleeves, pants, a wide-brimmed hat, and sunglasses whenever you are outside.  Heart disease, diabetes, and high blood pressure  High blood pressure causes heart disease and increases the risk of stroke. High blood pressure is more likely to develop in: ? People who have blood pressure in the high end of the normal range (130-139/85-89 mm Hg). ? People who are overweight or obese. ? People who are African American.  If you are 55-1 years of age, have your blood pressure checked every 3-5 years. If you are 86 years of age or older, have your blood pressure checked every year. You should have your blood pressure measured twice-once when you are at a hospital or clinic, and once when you are not at a hospital or clinic. Record the average of the  two measurements. To check your blood pressure when you are not at a hospital or clinic, you can use: ? An automated blood pressure machine at a pharmacy. ? A home blood pressure monitor.  If you are between 15 years and 81 years old, ask your health care provider if you should take aspirin to prevent strokes.  Have regular diabetes screenings. This involves taking a blood sample to check your fasting blood sugar level. ? If you are at a normal weight and have a low risk for diabetes, have this test once every three years after 70 years of age. ? If you are overweight and have a high risk for diabetes, consider being tested at a younger age or more often. Preventing infection Hepatitis B  If you have a higher risk for hepatitis B, you should be screened for this virus. You are considered at high risk for hepatitis B if: ? You were born in a country where hepatitis B is common. Ask your health care provider which countries are considered high risk. ? Your parents were born in a high-risk country, and you have not been immunized against hepatitis B (hepatitis B vaccine). ? You have HIV or AIDS. ? You use needles to inject street drugs. ? You live with someone who has hepatitis B. ? You have had sex with someone who has hepatitis B. ? You get hemodialysis treatment. ? You take certain medicines for conditions, including cancer, organ transplantation, and autoimmune conditions.  Hepatitis C  Blood testing is recommended for: ? Everyone born from [birth year] through 1964. ? Anyone with known risk factors for hepatitis C.  Sexually transmitted infections (STIs)  You should be screened for sexually transmitted infections (STIs) including gonorrhea and chlamydia if: ? You are sexually active and are younger than 70 years of age. ? You are older than 70 years of age and your health care provider tells you that you are at risk for this type of infection. ? Your sexual activity has changed since you  were last screened and you are at an increased risk for chlamydia or gonorrhea. Ask your health care provider if you are at risk.  If you do not have HIV, but are at risk, it may be recommended that you take a prescription medicine daily to prevent HIV infection. This is called pre-exposure prophylaxis (PrEP). You are considered at risk if: ? You are sexually active and do not regularly use condoms or know the HIV status of your partner(s). ? You take drugs by  injection. ? You are sexually active with a partner who has HIV.  Talk with your health care provider about whether you are at high risk of being infected with HIV. If you choose to begin PrEP, you should first be tested for HIV. You should then be tested every 3 months for as long as you are taking PrEP. Pregnancy  If you are premenopausal and you may become pregnant, ask your health care provider about preconception counseling.  If you may become pregnant, take 400 to 800 micrograms (mcg) of folic acid every day.  If you want to prevent pregnancy, talk to your health care provider about birth control (contraception). Osteoporosis and menopause  Osteoporosis is a disease in which the bones lose minerals and strength with aging. This can result in serious bone fractures. Your risk for osteoporosis can be identified using a bone density scan.  If you are 69 years of age or older, or if you are at risk for osteoporosis and fractures, ask your health care provider if you should be screened.  Ask your health care provider whether you should take a calcium or vitamin D supplement to lower your risk for osteoporosis.  Menopause may have certain physical symptoms and risks.  Hormone replacement therapy may reduce some of these symptoms and risks. Talk to your health care provider about whether hormone replacement therapy is right for you. Follow these instructions at home:  Schedule regular health, dental, and eye exams.  Stay current  with your immunizations.  Do not use any tobacco products including cigarettes, chewing tobacco, or electronic cigarettes.  If you are pregnant, do not drink alcohol.  If you are breastfeeding, limit how much and how often you drink alcohol.  Limit alcohol intake to no more than 1 drink per day for nonpregnant women. One drink equals 12 ounces of beer, 5 ounces of wine, or 1 ounces of hard liquor.  Do not use street drugs.  Do not share needles.  Ask your health care provider for help if you need support or information about quitting drugs.  Tell your health care provider if you often feel depressed.  Tell your health care provider if you have ever been abused or do not feel safe at home. This information is not intended to replace advice given to you by your health care provider. Make sure you discuss any questions you have with your health care provider. Document Released: 01/31/2011 Document Revised: 12/24/2015 Document Reviewed: 04/21/2015 Elsevier Interactive Patient Education  [employer].

## 2017-07-16 NOTE — Progress Notes (Signed)
Subjective:    Patient ID: [name], female    DOB: 1946/04/18, 71 y.o.   MRN: [id number]  HPI She is here for a physical exam.   Left arm pain:   She has pain from her left shoulder to elbow.  It is worse with laying on it or lifting her arm.  She thinks it is arthritis.  She has not it evaluated in the past.  She feels it is mild and does not need to be evaluated.  She denies changes in her history.    Medications and allergies reviewed with patient and updated if appropriate.  Patient Active Problem List   Diagnosis Date Noted  . 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 Closed compression fracture of L1 lumbar vertebra (HCC) 07/14/2016  . Gait instability 07/14/2016  . Back pain 07/08/2015  . Allergic rhinitis 06/02/2015  . Lumbar radiculopathy 06/02/2015    Current Outpatient Medications on File Prior to Visit  Medication Sig Dispense Refill  . aspirin EC 81 MG tablet Take 81 mg by mouth daily.    Marland Kitchen. Bioflavonoid Products (BIOFLEX PO) Take 1 capsule by mouth daily.      No current facility-administered medications on file prior to visit.     Past Medical History:  Diagnosis Date  . Arthritis    Hands and elbows  . GERD (gastroesophageal reflux disease)    tums prn  . Medical history non-contributory   . Seasonal allergies     Past Surgical History:  Procedure Laterality Date  . KYPHOPLASTY N/A 07/08/2015   Procedure: L1 KYPHOPLASTY;  Surgeon: Venita Lickahari Brooks, MD;  Location: [facility] OR;  Service: Orthopedics;  Laterality: N/A;  . ORIF ANKLE FRACTURE Right 2003  . TRANSFORAMINAL LUMBAR INTERBODY FUSION (TLIF) WITH PEDICLE SCREW FIXATION 1 LEVEL N/A 07/08/2015   Procedure: L5 - S1 TRANSFORAMINAL LUMBAR INTERBODY FUSION (TLIF) WITH PEDICLE SCREW FIXATION 1 LEVEL;  Surgeon: Venita Lickahari Brooks, MD;  Location: [facility] OR;  Service: Orthopedics;  Laterality: N/A;    Social History   Socioeconomic History  . Marital status: Married    Spouse name: None  . Number of children: None   . Years of education: None  . Highest education level: None  Social Needs  . Financial resource strain: None  . Food insecurity - worry: None  . Food insecurity - inability: None  . Transportation needs - medical: None  . Transportation needs - non-medical: None  Occupational History  . None  Tobacco Use  . Smoking status: Never Smoker  . Smokeless tobacco: Never Used  Substance and Sexual Activity  . Alcohol use: No  . Drug use: No  . Sexual activity: None  Other Topics Concern  . None  Social History Narrative  . None    Family History  Problem Relation Age of Onset  . Diabetes Mother   . Hypertension Mother   . Muscular dystrophy Father   . Hypotension Father   . Diabetes Maternal Grandmother     Review of Systems  Constitutional: Negative for appetite change, chills, fatigue and fever.  Eyes: Negative for visual disturbance.  Respiratory: Negative for cough, shortness of breath and wheezing.   Cardiovascular: Negative for chest pain, palpitations and leg swelling.  Gastrointestinal: Negative for abdominal pain, blood in stool, constipation, diarrhea and nausea.  Genitourinary: Negative for dysuria and hematuria.  Musculoskeletal: Positive for arthralgias and back pain.  Skin: Negative for color change and rash.  Neurological: Negative for light-headedness and headaches.  Psychiatric/Behavioral:  Negative for dysphoric mood. The patient is not nervous/anxious.        Objective:   Vitals:   07/17/17 1356  BP: 122/80  Pulse: 77  Resp: 16  Temp: 98.2 F (36.8 C)  SpO2: 96%   Filed Weights   07/17/17 1356  Weight: 211 lb (95.7 kg)   Body mass index is 32.08 kg/m.  Wt Readings from Last 3 Encounters:  07/17/17 211 lb (95.7 kg)  01/16/17 213 lb 14 oz (97 kg)  07/14/16 213 lb (96.6 kg)     Physical Exam Constitutional: She appears well-developed and well-nourished. No distress.  HENT:  Head: Normocephalic and atraumatic.  Right Ear: External ear  normal. Normal ear canal and TM Left Ear: External ear normal.  Normal ear canal and TM Mouth/Throat: Oropharynx is clear and moist.  Eyes: Conjunctivae and EOM are normal.  Neck: Neck supple. No tracheal deviation present. No thyromegaly present.  No carotid bruit  Cardiovascular: Normal rate, regular rhythm and normal heart sounds.   No murmur heard.  No edema. Pulmonary/Chest: Effort normal and breath sounds normal. No respiratory distress. She has no wheezes. She has no rales.  Breast: deferred   Abdominal: Soft. She exhibits no distension. There is no tenderness.  Lymphadenopathy: She has no cervical adenopathy.  Skin: Skin is warm and dry. She is not diaphoretic.  Psychiatric: She has a normal mood and affect. Her behavior is normal.        Assessment & Plan:   Physical exam: Screening blood work ordered Immunizations  Discussed shingrix, flu up to date, others up to date Colonoscopy  Never had one - discussed colonoscopy, cologuard Mammogram  - discussed having a mammogram - she will think about it Gyn - no longer seeing Dexa   Discussed dexa - she will think about it Eye exams   Up to date  EKG   Last done 2016 Exercise - does some minimal walking Weight  - advised weight loss   Skin  No concerns Substance abuse    none  See Problem List for Assessment and Plan of chronic medical problems.    FU in one year

## 2017-07-17 ENCOUNTER — Encounter: Payer: Self-pay | Admitting: Internal Medicine

## 2017-07-17 ENCOUNTER — Other Ambulatory Visit (INDEPENDENT_AMBULATORY_CARE_PROVIDER_SITE_OTHER): Payer: Medicare HMO

## 2017-07-17 ENCOUNTER — Ambulatory Visit (INDEPENDENT_AMBULATORY_CARE_PROVIDER_SITE_OTHER): Payer: Medicare HMO | Admitting: Internal Medicine

## 2017-07-17 VITALS — BP 122/80 | HR 77 | Temp 98.2°F | Resp 16 | Ht 68.0 in | Wt 211.0 lb

## 2017-07-17 DIAGNOSIS — Z1331 Encounter for screening for depression: Secondary | ICD-10-CM | POA: Diagnosis not present

## 2017-07-17 DIAGNOSIS — Z0000 Encounter for general adult medical examination without abnormal findings: Secondary | ICD-10-CM

## 2017-07-17 DIAGNOSIS — M5416 Radiculopathy, lumbar region: Secondary | ICD-10-CM | POA: Diagnosis not present

## 2017-07-17 LAB — COMPREHENSIVE METABOLIC PANEL
ALBUMIN: 3.9 g/dL (ref 3.5–5.2)
ALT: 16 U/L (ref 0–35)
AST: 25 U/L (ref 0–37)
Alkaline Phosphatase: 85 U/L (ref 39–117)
BUN: 17 mg/dL (ref 6–23)
CHLORIDE: 106 meq/L (ref 96–112)
CO2: 28 mEq/L (ref 19–32)
CREATININE: 0.84 mg/dL (ref 0.40–1.20)
Calcium: 9.5 mg/dL (ref 8.4–10.5)
GFR: 71.17 mL/min (ref >60.00)
GLUCOSE: 79 mg/dL (ref 70–99)
POTASSIUM: 4.5 meq/L (ref 3.5–5.1)
SODIUM: 140 meq/L (ref 135–145)
TOTAL PROTEIN: 7.8 g/dL (ref 6.0–8.3)
Total Bilirubin: 0.6 mg/dL (ref 0.2–1.2)

## 2017-07-17 LAB — CBC WITH DIFFERENTIAL/PLATELET
BASOS PCT: 0.4 % (ref 0.0–3.0)
Basophils Absolute: 0 10*3/uL (ref 0.0–0.1)
EOS ABS: 0.1 10*3/uL (ref 0.0–0.7)
EOS PCT: 0.9 % (ref 0.0–5.0)
HCT: 42.9 % (ref 36.0–46.0)
Hemoglobin: 14.2 g/dL (ref 12.0–15.0)
LYMPHS ABS: 2.7 10*3/uL (ref 0.7–4.0)
Lymphocytes Relative: 35.3 % (ref 12.0–46.0)
MCHC: 33 g/dL (ref 30.0–36.0)
MCV: 88.8 fl (ref 78.0–100.0)
MONO ABS: 0.5 10*3/uL (ref 0.1–1.0)
Monocytes Relative: 6.5 % (ref 3.0–12.0)
NEUTROS PCT: 56.9 % (ref 43.0–77.0)
Neutro Abs: 4.3 10*3/uL (ref 1.4–7.7)
PLATELETS: 196 10*3/uL (ref 150.0–400.0)
RBC: 4.82 Mil/uL (ref 3.87–5.11)
RDW: 15.3 % (ref 11.5–15.5)
WBC: 7.5 10*3/uL (ref 4.0–10.5)

## 2017-07-17 LAB — LDL CHOLESTEROL, DIRECT: LDL DIRECT: 97 mg/dL

## 2017-07-17 LAB — LIPID PANEL
Cholesterol: 165 mg/dL (ref 0–200)
HDL: 47.9 mg/dL (ref >39.00)
NONHDL: 117.59
Total CHOL/HDL Ratio: 3
Triglycerides: 221 mg/dL — ABNORMAL HIGH (ref 0.0–149.0)
VLDL: 44.2 mg/dL — ABNORMAL HIGH (ref 0.0–40.0)

## 2017-07-17 LAB — TSH: TSH: 1.3 u[IU]/mL (ref 0.35–4.50)

## 2017-07-17 MED ORDER — VITAMIN D 1000 UNITS PO TABS: 1000.0000 [IU] | ORAL_TABLET | Freq: Every day | ORAL | Status: AC

## 2017-07-17 NOTE — Assessment & Plan Note (Signed)
Has chronic pain in back and numbness in left leg chronically Has done PT Walks with a cane

## 2018-07-17 NOTE — Patient Instructions (Addendum)
Tests ordered today. Your results will be released to [facility] (or called to you) after review, usually within 72hours after test completion. If any changes need to be made, you will be notified at that same time.  All other Health Maintenance issues reviewed.   All recommended immunizations and age-appropriate screenings are up-to-date or discussed.  No immunizations administered today.   Medications reviewed and updated.  Changes include :   none  A referral was ordered for GI  A mammogram was ordered.  A bone density was ordered.    Please followup in one year   Health Maintenance, Female Adopting a healthy lifestyle and getting preventive care can go a long way to promote health and wellness. Talk with your health care provider about what schedule of regular examinations is right for you. This is a good chance for you to check in with your provider about disease prevention and staying healthy. In between checkups, there are plenty of things you can do on your own. Experts have done a lot of research about which lifestyle changes and preventive measures are most likely to keep you healthy. Ask your health care provider for more information. Weight and diet Eat a healthy diet  Be sure to include plenty of vegetables, fruits, low-fat dairy products, and lean protein.  Do not eat a lot of foods high in solid fats, added sugars, or salt.  Get regular exercise. This is one of the most important things you can do for your health. ? Most adults should exercise for at least 150 minutes each week. The exercise should increase your heart rate and make you sweat (moderate-intensity exercise). ? Most adults should also do strengthening exercises at least twice a week. This is in addition to the moderate-intensity exercise.  Maintain a healthy weight  Body mass index (BMI) is a measurement that can be used to identify possible weight problems. It estimates body fat based on height and weight.  Your health care provider can help determine your BMI and help you achieve or maintain a healthy weight.  For females 95 years of age and older: ? A BMI below 18.5 is considered underweight. ? A BMI of 18.5 to 24.9 is normal. ? A BMI of 25 to 29.9 is considered overweight. ? A BMI of 30 and above is considered obese.  Watch levels of cholesterol and blood lipids  You should start having your blood tested for lipids and cholesterol at 72 years of age, then have this test every 5 years.  You may need to have your cholesterol levels checked more often if: ? Your lipid or cholesterol levels are high. ? You are older than 71 years of age. ? You are at high risk for heart disease.  Cancer screening Lung Cancer  Lung cancer screening is recommended for adults 30-69 years old who are at high risk for lung cancer because of a history of smoking.  A yearly low-dose CT scan of the lungs is recommended for people who: ? Currently smoke. ? Have quit within the past 15 years. ? Have at least a 30-pack-year history of smoking. A pack year is smoking an average of one pack of cigarettes a day for 1 year.  Yearly screening should continue until it has been 15 years since you quit.  Yearly screening should stop if you develop a health problem that would prevent you from having lung cancer treatment.  Breast Cancer  Practice breast self-awareness. This means understanding how your breasts normally appear  and feel.  It also means doing regular breast self-exams. Let your health care provider know about any changes, no matter how small.  If you are in your 20s or 30s, you should have a clinical breast exam (CBE) by a health care provider every 1-3 years as part of a regular health exam.  If you are 52 or older, have a CBE every year. Also consider having a breast X-ray (mammogram) every year.  If you have a family history of breast cancer, talk to your health care provider about genetic  screening.  If you are at high risk for breast cancer, talk to your health care provider about having an MRI and a mammogram every year.  Breast cancer gene (BRCA) assessment is recommended for women who have family members with BRCA-related cancers. BRCA-related cancers include: ? Breast. ? Ovarian. ? Tubal. ? Peritoneal cancers.  Results of the assessment will determine the need for genetic counseling and BRCA1 and BRCA2 testing.  Cervical Cancer Your health care provider may recommend that you be screened regularly for cancer of the pelvic organs (ovaries, uterus, and vagina). This screening involves a pelvic examination, including checking for microscopic changes to the surface of your cervix (Pap test). You may be encouraged to have this screening done every 3 years, beginning at age 72.  For women ages 37-65, health care providers may recommend pelvic exams and Pap testing every 3 years, or they may recommend the Pap and pelvic exam, combined with testing for human papilloma virus (HPV), every 5 years. Some types of HPV increase your risk of cervical cancer. Testing for HPV may also be done on women of any age with unclear Pap test results.  Other health care providers may not recommend any screening for nonpregnant women who are considered low risk for pelvic cancer and who do not have symptoms. Ask your health care provider if a screening pelvic exam is right for you.  If you have had past treatment for cervical cancer or a condition that could lead to cancer, you need Pap tests and screening for cancer for at least 20 years after your treatment. If Pap tests have been discontinued, your risk factors (such as having a new sexual partner) need to be reassessed to determine if screening should resume. Some women have medical problems that increase the chance of getting cervical cancer. In these cases, your health care provider may recommend more frequent screening and Pap  tests.  Colorectal Cancer  This type of cancer can be detected and often prevented.  Routine colorectal cancer screening usually begins at 71 years of age and continues through 71 years of age.  Your health care provider may recommend screening at an earlier age if you have risk factors for colon cancer.  Your health care provider may also recommend using home test kits to check for hidden blood in the stool.  A small camera at the end of a tube can be used to examine your colon directly (sigmoidoscopy or colonoscopy). This is done to check for the earliest forms of colorectal cancer.  Routine screening usually begins at age 51.  Direct examination of the colon should be repeated every 5-10 years through 71 years of age. However, you may need to be screened more often if early forms of precancerous polyps or small growths are found.  Skin Cancer  Check your skin from head to toe regularly.  Tell your health care provider about any new moles or changes in moles, especially if  there is a change in a mole's shape or color.  Also tell your health care provider if you have a mole that is larger than the size of a pencil eraser.  Always use sunscreen. Apply sunscreen liberally and repeatedly throughout the day.  Protect yourself by wearing long sleeves, pants, a wide-brimmed hat, and sunglasses whenever you are outside.  Heart disease, diabetes, and high blood pressure  High blood pressure causes heart disease and increases the risk of stroke. High blood pressure is more likely to develop in: ? People who have blood pressure in the high end of the normal range (130-139/85-89 mm Hg). ? People who are overweight or obese. ? People who are African American.  If you are 81-59 years of age, have your blood pressure checked every 3-5 years. If you are 26 years of age or older, have your blood pressure checked every year. You should have your blood pressure measured twice-once when you are at  a hospital or clinic, and once when you are not at a hospital or clinic. Record the average of the two measurements. To check your blood pressure when you are not at a hospital or clinic, you can use: ? An automated blood pressure machine at a pharmacy. ? A home blood pressure monitor.  If you are between 74 years and 67 years old, ask your health care provider if you should take aspirin to prevent strokes.  Have regular diabetes screenings. This involves taking a blood sample to check your fasting blood sugar level. ? If you are at a normal weight and have a low risk for diabetes, have this test once every three years after 71 years of age. ? If you are overweight and have a high risk for diabetes, consider being tested at a younger age or more often. Preventing infection Hepatitis B  If you have a higher risk for hepatitis B, you should be screened for this virus. You are considered at high risk for hepatitis B if: ? You were born in a country where hepatitis B is common. Ask your health care provider which countries are considered high risk. ? Your parents were born in a high-risk country, and you have not been immunized against hepatitis B (hepatitis B vaccine). ? You have HIV or AIDS. ? You use needles to inject street drugs. ? You live with someone who has hepatitis B. ? You have had sex with someone who has hepatitis B. ? You get hemodialysis treatment. ? You take certain medicines for conditions, including cancer, organ transplantation, and autoimmune conditions.  Hepatitis C  Blood testing is recommended for: ? Everyone born from [birth year] through 1964. ? Anyone with known risk factors for hepatitis C.  Sexually transmitted infections (STIs)  You should be screened for sexually transmitted infections (STIs) including gonorrhea and chlamydia if: ? You are sexually active and are younger than 71 years of age. ? You are older than 71 years of age and your health care provider tells  you that you are at risk for this type of infection. ? Your sexual activity has changed since you were last screened and you are at an increased risk for chlamydia or gonorrhea. Ask your health care provider if you are at risk.  If you do not have HIV, but are at risk, it may be recommended that you take a prescription medicine daily to prevent HIV infection. This is called pre-exposure prophylaxis (PrEP). You are considered at risk if: ? You are sexually active and  do not regularly use condoms or know the HIV status of your partner(s). ? You take drugs by injection. ? You are sexually active with a partner who has HIV.  Talk with your health care provider about whether you are at high risk of being infected with HIV. If you choose to begin PrEP, you should first be tested for HIV. You should then be tested every 3 months for as long as you are taking PrEP. Pregnancy  If you are premenopausal and you may become pregnant, ask your health care provider about preconception counseling.  If you may become pregnant, take 400 to 800 micrograms (mcg) of folic acid every day.  If you want to prevent pregnancy, talk to your health care provider about birth control (contraception). Osteoporosis and menopause  Osteoporosis is a disease in which the bones lose minerals and strength with aging. This can result in serious bone fractures. Your risk for osteoporosis can be identified using a bone density scan.  If you are 32 years of age or older, or if you are at risk for osteoporosis and fractures, ask your health care provider if you should be screened.  Ask your health care provider whether you should take a calcium or vitamin D supplement to lower your risk for osteoporosis.  Menopause may have certain physical symptoms and risks.  Hormone replacement therapy may reduce some of these symptoms and risks. Talk to your health care provider about whether hormone replacement therapy is right for  you. Follow these instructions at home:  Schedule regular health, dental, and eye exams.  Stay current with your immunizations.  Do not use any tobacco products including cigarettes, chewing tobacco, or electronic cigarettes.  If you are pregnant, do not drink alcohol.  If you are breastfeeding, limit how much and how often you drink alcohol.  Limit alcohol intake to no more than 1 drink per day for nonpregnant women. One drink equals 12 ounces of beer, 5 ounces of wine, or 1 ounces of hard liquor.  Do not use street drugs.  Do not share needles.  Ask your health care provider for help if you need support or information about quitting drugs.  Tell your health care provider if you often feel depressed.  Tell your health care provider if you have ever been abused or do not feel safe at home. This information is not intended to replace advice given to you by your health care provider. Make sure you discuss any questions you have with your health care provider. Document Released: 01/31/2011 Document Revised: 12/24/2015 Document Reviewed: 04/21/2015 Elsevier Interactive Patient Education  [employer].

## 2018-07-17 NOTE — Progress Notes (Signed)
Subjective:    Patient ID: [name], female    DOB: Oct 06, 1945, 72 y.o.   MRN: [id number]  HPI She is here for a physical exam.   Left heel pain with walking and there is a hard place that she noticed two weeks ago.    She has no other concerns.   Medications and allergies reviewed with patient and updated if appropriate.  Patient Active Problem List   Diagnosis Date Noted  . 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 Closed compression fracture of L1 vertebra (HCC) 07/14/2016  . Gait instability 07/14/2016  . Back pain 07/08/2015  . Allergic rhinitis 06/02/2015  . Lumbar radiculopathy 06/02/2015    Current Outpatient Medications on File Prior to Visit  Medication Sig Dispense Refill  . aspirin EC 81 MG tablet Take 81 mg by mouth daily.    Marland Kitchen Bioflavonoid Products (BIOFLEX PO) Take 1 capsule by mouth daily.     . cholecalciferol (VITAMIN D) 1000 units tablet Take 1 tablet (1,000 Units total) by mouth daily.     No current facility-administered medications on file prior to visit.     Past Medical History:  Diagnosis Date  . Arthritis    Hands and elbows  . GERD (gastroesophageal reflux disease)    tums prn  . Medical history non-contributory   . Seasonal allergies     Past Surgical History:  Procedure Laterality Date  . KYPHOPLASTY N/A 07/08/2015   Procedure: L1 KYPHOPLASTY;  Surgeon: Venita Lick, MD;  Location: [facility] OR;  Service: Orthopedics;  Laterality: N/A;  . ORIF ANKLE FRACTURE Right 2003  . TRANSFORAMINAL LUMBAR INTERBODY FUSION (TLIF) WITH PEDICLE SCREW FIXATION 1 LEVEL N/A 07/08/2015   Procedure: L5 - S1 TRANSFORAMINAL LUMBAR INTERBODY FUSION (TLIF) WITH PEDICLE SCREW FIXATION 1 LEVEL;  Surgeon: Venita Lick, MD;  Location: [facility] OR;  Service: Orthopedics;  Laterality: N/A;    Social History   Socioeconomic History  . Marital status: Married    Spouse name: Not on file  . Number of children: Not on file  . Years of education: Not on file  . Highest  education level: Not on file  Occupational History  . Not on file  Social Needs  . Financial resource strain: Not on file  . Food insecurity:    Worry: Not on file    Inability: Not on file  . Transportation needs:    Medical: Not on file    Non-medical: Not on file  Tobacco Use  . Smoking status: Never Smoker  . Smokeless tobacco: Never Used  Substance and Sexual Activity  . Alcohol use: No  . Drug use: No  . Sexual activity: Not on file  Lifestyle  . Physical activity:    Days per week: Not on file    Minutes per session: Not on file  . Stress: Not on file  Relationships  . Social connections:    Talks on phone: Not on file    Gets together: Not on file    Attends religious service: Not on file    Active member of club or organization: Not on file    Attends meetings of clubs or organizations: Not on file    Relationship status: Not on file  Other Topics Concern  . Not on file  Social History Narrative  . Not on file    Family History  Problem Relation Age of Onset  . Diabetes Mother   . Hypertension Mother   . Muscular dystrophy  Father   . Hypotension Father   . Diabetes Maternal Grandmother     Review of Systems  Constitutional: Negative for chills, fatigue and fever.  Eyes: Negative for visual disturbance.  Respiratory: Negative for cough, shortness of breath and wheezing.   Cardiovascular: Negative for chest pain, palpitations and leg swelling.  Gastrointestinal: Negative for abdominal pain, blood in stool, constipation, diarrhea and nausea.       GERD occasional  Genitourinary: Negative for dysuria and hematuria.  Musculoskeletal: Positive for arthralgias (mild hand OA) and back pain.  Skin: Negative for color change and rash.  Neurological: Negative for dizziness, light-headedness and headaches.  Psychiatric/Behavioral: Negative for dysphoric mood. The patient is not nervous/anxious.        Objective:   Vitals:   07/18/18 0851  BP: 136/82    Pulse: 61  Resp: 16  Temp: 97.8 F (36.6 C)  SpO2: 98%   Filed Weights   07/18/18 0851  Weight: 214 lb (97.1 kg)   Body mass index is 32.54 kg/m.  BP Readings from Last 3 Encounters:  07/18/18 136/82  07/17/17 122/80  01/16/17 118/70    Wt Readings from Last 3 Encounters:  07/18/18 214 lb (97.1 kg)  07/17/17 211 lb (95.7 kg)  01/16/17 213 lb 14 oz (97 kg)     Physical Exam Constitutional: She appears well-developed and well-nourished. No distress.  HENT:  Head: Normocephalic and atraumatic.  Right Ear: External ear normal. Normal ear canal and TM Left Ear: External ear normal.  Normal ear canal and TM Mouth/Throat: Oropharynx is clear and moist.  Eyes: Conjunctivae and EOM are normal.  Neck: Neck supple. No tracheal deviation present. No thyromegaly present.  No carotid bruit  Cardiovascular: Normal rate, regular rhythm and normal heart sounds.   No murmur heard.  No edema. Pulmonary/Chest: Effort normal and breath sounds normal. No respiratory distress. She has no wheezes. She has no rales.  Breast: deferred   Abdominal: Soft. She exhibits no distension. There is no tenderness.  Lymphadenopathy: She has no cervical adenopathy.  Skin: Skin is warm and dry. She is not diaphoretic.  Callus formation center of plantar heel - mildly tender  Psychiatric: She has a normal mood and affect. Her behavior is normal.        Assessment & Plan:   Physical exam: Screening blood work  ordered Immunizations  Discussed shingrix, flu vaccine done a pharmacy Colonoscopy    Never had - agreed to have - referred to GI Mammogram    ordered Gyn   N/A Dexa  ordered Eye exams   Up to date  EKG   Done 2016 Exercise   Tries to be active, no regular exercise Weight  Overweight - stable Skin   No concerns Substance abuse    none  See Problem List for Assessment and Plan of chronic medical problems.   FU in one year

## 2018-07-18 ENCOUNTER — Ambulatory Visit (INDEPENDENT_AMBULATORY_CARE_PROVIDER_SITE_OTHER): Payer: Medicare HMO | Admitting: Internal Medicine

## 2018-07-18 ENCOUNTER — Encounter: Payer: Self-pay | Admitting: Internal Medicine

## 2018-07-18 ENCOUNTER — Other Ambulatory Visit (INDEPENDENT_AMBULATORY_CARE_PROVIDER_SITE_OTHER): Payer: Medicare HMO

## 2018-07-18 ENCOUNTER — Ambulatory Visit (INDEPENDENT_AMBULATORY_CARE_PROVIDER_SITE_OTHER)
Admission: RE | Admit: 2018-07-18 | Discharge: 2018-07-18 | Disposition: A | Discharge status: Home / Self Care | Payer: Medicare HMO | Source: Ambulatory Visit | Attending: Internal Medicine | Admitting: Internal Medicine

## 2018-07-18 VITALS — BP 136/82 | HR 61 | Temp 97.8°F | Resp 16 | Ht 68.0 in | Wt 214.0 lb

## 2018-07-18 DIAGNOSIS — E2839 Other primary ovarian failure: Secondary | ICD-10-CM

## 2018-07-18 DIAGNOSIS — Z1211 Encounter for screening for malignant neoplasm of colon: Secondary | ICD-10-CM

## 2018-07-18 DIAGNOSIS — M79672 Pain in left foot: Secondary | ICD-10-CM

## 2018-07-18 DIAGNOSIS — Z1231 Encounter for screening mammogram for malignant neoplasm of breast: Secondary | ICD-10-CM

## 2018-07-18 DIAGNOSIS — M5416 Radiculopathy, lumbar region: Secondary | ICD-10-CM | POA: Diagnosis not present

## 2018-07-18 DIAGNOSIS — Z13820 Encounter for screening for osteoporosis: Secondary | ICD-10-CM | POA: Diagnosis not present

## 2018-07-18 DIAGNOSIS — Z0000 Encounter for general adult medical examination without abnormal findings: Secondary | ICD-10-CM

## 2018-07-18 DIAGNOSIS — S32010D Wedge compression fracture of first lumbar vertebra, subsequent encounter for fracture with routine healing: Secondary | ICD-10-CM

## 2018-07-18 LAB — COMPREHENSIVE METABOLIC PANEL
ALT: 19 U/L (ref 0–35)
AST: 28 U/L (ref 0–37)
Albumin: 4 g/dL (ref 3.5–5.2)
Alkaline Phosphatase: 89 U/L (ref 39–117)
BUN: 11 mg/dL (ref 6–23)
CO2: 27 mEq/L (ref 19–32)
Calcium: 9.3 mg/dL (ref 8.4–10.5)
Chloride: 106 mEq/L (ref 96–112)
Creatinine, Ser: 0.66 mg/dL (ref 0.40–1.20)
GFR: 93.74 mL/min (ref >60.00)
Glucose, Bld: 97 mg/dL (ref 70–99)
POTASSIUM: 4.2 meq/L (ref 3.5–5.1)
Sodium: 140 mEq/L (ref 135–145)
Total Bilirubin: 0.5 mg/dL (ref 0.2–1.2)
Total Protein: 7.7 g/dL (ref 6.0–8.3)

## 2018-07-18 LAB — CBC WITH DIFFERENTIAL/PLATELET
BASOS PCT: 0.4 % (ref 0.0–3.0)
Basophils Absolute: 0 10*3/uL (ref 0.0–0.1)
Eosinophils Absolute: 0.1 10*3/uL (ref 0.0–0.7)
Eosinophils Relative: 1 % (ref 0.0–5.0)
HEMATOCRIT: 43.8 % (ref 36.0–46.0)
Hemoglobin: 14.6 g/dL (ref 12.0–15.0)
LYMPHS PCT: 38.7 % (ref 12.0–46.0)
Lymphs Abs: 2.5 10*3/uL (ref 0.7–4.0)
MCHC: 33.3 g/dL (ref 30.0–36.0)
MCV: 87.8 fl (ref 78.0–100.0)
Monocytes Absolute: 0.4 10*3/uL (ref 0.1–1.0)
Monocytes Relative: 5.6 % (ref 3.0–12.0)
Neutro Abs: 3.6 10*3/uL (ref 1.4–7.7)
Neutrophils Relative %: 54.3 % (ref 43.0–77.0)
Platelets: 188 10*3/uL (ref 150.0–400.0)
RBC: 4.99 Mil/uL (ref 3.87–5.11)
RDW: 15.2 % (ref 11.5–15.5)
WBC: 6.6 10*3/uL (ref 4.0–10.5)

## 2018-07-18 LAB — LIPID PANEL
Cholesterol: 170 mg/dL (ref 0–200)
HDL: 48.8 mg/dL (ref >39.00)
LDL CALC: 90 mg/dL (ref 0–99)
NonHDL: 120.78
Total CHOL/HDL Ratio: 3
Triglycerides: 155 mg/dL — ABNORMAL HIGH (ref 0.0–149.0)
VLDL: 31 mg/dL (ref 0.0–40.0)

## 2018-07-18 LAB — TSH: TSH: 1.54 u[IU]/mL (ref 0.35–4.50)

## 2018-07-18 NOTE — Assessment & Plan Note (Signed)
Chronic pain and numbness left lower leg Uses cane, walker for longer distances Activity is limited - advised to increase activity as much as possible No longer following with ortho

## 2018-07-18 NOTE — Assessment & Plan Note (Signed)
Chronic back pain Agreed to dexa - ordered Continue vitamin d daily Increase activity as tolerated

## 2018-07-18 NOTE — Assessment & Plan Note (Signed)
Has callus formation that is tender - possible spur Deferred podiatry referral at this time but will let me know if she wants to see one Will try extra padding to see if that helps

## 2018-07-19 ENCOUNTER — Encounter: Payer: Self-pay | Admitting: Internal Medicine

## 2018-07-19 DIAGNOSIS — M810 Age-related osteoporosis without current pathological fracture: Secondary | ICD-10-CM | POA: Insufficient documentation

## 2018-07-19 DIAGNOSIS — M8580 Other specified disorders of bone density and structure, unspecified site: Secondary | ICD-10-CM | POA: Insufficient documentation

## 2018-09-27 ENCOUNTER — Encounter: Payer: Self-pay | Admitting: Internal Medicine

## 2019-07-21 DIAGNOSIS — Z833 Family history of diabetes mellitus: Secondary | ICD-10-CM | POA: Insufficient documentation

## 2019-07-21 NOTE — Progress Notes (Signed)
Subjective:    Patient ID: [name], female    DOB: 01/12/1946, 73 y.o.   MRN: [id number]  HPI She is here for a physical exam.   She denies any major changes in her history.  She has no concerns.  Medications and allergies reviewed with patient and updated if appropriate.  Patient Active Problem List   Diagnosis Date Noted  . Family history of diabetes mellitus in mother 07/21/2019  . Osteopenia 07/19/2018  . Pain of left heel 07/18/2018  . 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 Closed compression fracture of L1 vertebra ([place]) 07/14/2016  . Gait instability 07/14/2016  . Back pain 07/08/2015  . Allergic rhinitis 06/02/2015  . Lumbar radiculopathy 06/02/2015    Current Outpatient Medications on File Prior to Visit  Medication Sig Dispense Refill  . Bioflavonoid Products (BIOFLEX PO) Take 1 capsule by mouth daily.     . cholecalciferol (VITAMIN D) 1000 units tablet Take 1 tablet (1,000 Units total) by mouth daily.     No current facility-administered medications on file prior to visit.    Past Medical History:  Diagnosis Date  . Arthritis    Hands and elbows  . GERD (gastroesophageal reflux disease)    tums prn  . Medical history non-contributory   . Seasonal allergies     Past Surgical History:  Procedure Laterality Date  . KYPHOPLASTY N/A 07/08/2015   Procedure: L1 KYPHOPLASTY;  Surgeon: Melina Schools, MD;  Location: [place];  Service: Orthopedics;  Laterality: N/A;  . ORIF ANKLE FRACTURE Right 2003  . TRANSFORAMINAL LUMBAR INTERBODY FUSION (TLIF) WITH PEDICLE SCREW FIXATION 1 LEVEL N/A 07/08/2015   Procedure: L5 - S1 TRANSFORAMINAL LUMBAR INTERBODY FUSION (TLIF) WITH PEDICLE SCREW FIXATION 1 LEVEL;  Surgeon: Melina Schools, MD;  Location: [place];  Service: Orthopedics;  Laterality: N/A;    Social History   Socioeconomic History  . Marital status: Married    Spouse name: Not on file  . Number of children: Not on file  . Years of education: Not on file   . Highest education level: Not on file  Occupational History  . Not on file  Tobacco Use  . Smoking status: Never Smoker  . Smokeless tobacco: Never Used  Substance and Sexual Activity  . Alcohol use: No  . Drug use: No  . Sexual activity: Not on file  Other Topics Concern  . Not on file  Social History Narrative  . Not on file   Social Determinants of Health   Financial Resource Strain:   . Difficulty of Paying Living Expenses: Not on file  Food Insecurity:   . Worried About Charity fundraiser in the Last Year: Not on file  . Ran Out of Food in the Last Year: Not on file  Transportation Needs:   . Lack of Transportation (Medical): Not on file  . Lack of Transportation (Non-Medical): Not on file  Physical Activity:   . Days of Exercise per Week: Not on file  . Minutes of Exercise per Session: Not on file  Stress:   . Feeling of Stress : Not on file  Social Connections:   . Frequency of Communication with Friends and Family: Not on file  . Frequency of Social Gatherings with Friends and Family: Not on file  . Attends Religious Services: Not on file  . Active Member of Clubs or Organizations: Not on file  . Attends Archivist Meetings: Not on file  . Marital Status:  Not on file    Family History  Problem Relation Age of Onset  . Diabetes Mother   . Hypertension Mother   . Muscular dystrophy Father   . Hypotension Father   . Diabetes Maternal Grandmother     Review of Systems  Constitutional: Negative for chills and fever.  Eyes: Negative for visual disturbance.  Respiratory: Negative for cough, shortness of breath and wheezing.   Cardiovascular: Negative for chest pain, palpitations and leg swelling.  Gastrointestinal: Negative for abdominal pain, blood in stool, constipation, diarrhea and nausea.       Rare GERD  Genitourinary: Negative for dysuria and hematuria.  Musculoskeletal: Positive for back pain. Negative for arthralgias.  Skin: Negative  for color change and rash.  Neurological: Positive for numbness (left lateral leg). Negative for dizziness, light-headedness and headaches.  Psychiatric/Behavioral: Negative for dysphoric mood. The patient is not nervous/anxious.        Objective:   Vitals:   07/22/19 0848  BP: (!) 142/90  Pulse: 78  Resp: 16  Temp: 98.4 F (36.9 C)  SpO2: 97%   Filed Weights   07/22/19 0848  Weight: 219 lb 6.4 oz (99.5 kg)   Body mass index is 33.36 kg/m.  BP Readings from Last 3 Encounters:  07/22/19 (!) 142/90  07/18/18 136/82  07/17/17 122/80    Wt Readings from Last 3 Encounters:  07/22/19 219 lb 6.4 oz (99.5 kg)  07/18/18 214 lb (97.1 kg)  07/17/17 211 lb (95.7 kg)     Physical Exam Constitutional: She appears well-developed and well-nourished. No distress.  HENT:  Head: Normocephalic and atraumatic.  Right Ear: External ear normal. Normal ear canal and TM Left Ear: External ear normal.  Normal ear canal and TM Mouth/Throat: Oropharynx is clear and moist.  Eyes: Conjunctivae and EOM are normal.  Neck: Neck supple. No tracheal deviation present. No thyromegaly present.  No carotid bruit  Cardiovascular: Normal rate, regular rhythm and normal heart sounds.  No murmur heard.  No edema. Pulmonary/Chest: Effort normal and breath sounds normal. No respiratory distress. She has no wheezes. She has no rales.  Breast: deferred   Abdominal: Soft. She exhibits no distension. There is no tenderness.  Lymphadenopathy: She has no cervical adenopathy.  Skin: Skin is warm and dry. She is not diaphoretic.  Psychiatric: She has a normal mood and affect. Her behavior is normal.        Assessment & Plan:   Physical exam: Screening blood work    ordered Immunizations  Discussed shingrix, others up to date Colonoscopy  Never had one -will think about it Mammogram  Due - will schedule Gyn  N/a   Dexa  Up to date  Eye exams  Due  -- will schedule Exercise  Active - - will start using  new exercise bike at home.  Encouraged regular exercise to keep her muscles strong Weight  Encouraged weight loss Substance abuse  none  See Problem List for Assessment and Plan of chronic medical problems.    This visit occurred during the SARS-CoV-2 public health emergency.  Safety protocols were in place, including screening questions prior to the visit, additional usage of staff PPE, and extensive cleaning of exam room while observing appropriate contact time as indicated for disinfecting solutions.     Follow-up in 1 year, sooner if needed

## 2019-07-21 NOTE — Patient Instructions (Addendum)
Tests ordered today. Your results will be released to MyChart (or called to you) after review.  If any changes need to be made, you will be notified at that same time.  All other Health Maintenance issues reviewed.   All recommended immunizations and age-appropriate screenings are up-to-date or discussed.  No immunization administered today.   Medications reviewed and updated.  Changes include :   none   Please followup in 1 year   Health Maintenance, Female Adopting a healthy lifestyle and getting preventive care are important in promoting health and wellness. Ask your health care provider about:  The right schedule for you to have regular tests and exams.  Things you can do on your own to prevent diseases and keep yourself healthy. What should I know about diet, weight, and exercise? Eat a healthy diet   Eat a diet that includes plenty of vegetables, fruits, low-fat dairy products, and lean protein.  Do not eat a lot of foods that are high in solid fats, added sugars, or sodium. Maintain a healthy weight Body mass index (BMI) is used to identify weight problems. It estimates body fat based on height and weight. Your health care provider can help determine your BMI and help you achieve or maintain a healthy weight. Get regular exercise Get regular exercise. This is one of the most important things you can do for your health. Most adults should:  Exercise for at least 150 minutes each week. The exercise should increase your heart rate and make you sweat (moderate-intensity exercise).  Do strengthening exercises at least twice a week. This is in addition to the moderate-intensity exercise.  Spend less time sitting. Even light physical activity can be beneficial. Watch cholesterol and blood lipids Have your blood tested for lipids and cholesterol at 72 years of age, then have this test every 5 years. Have your cholesterol levels checked more often if:  Your lipid or cholesterol  levels are high.  You are older than 72 years of age.  You are at high risk for heart disease. What should I know about cancer screening? Depending on your health history and family history, you may need to have cancer screening at various ages. This may include screening for:  Breast cancer.  Cervical cancer.  Colorectal cancer.  Skin cancer.  Lung cancer. What should I know about heart disease, diabetes, and high blood pressure? Blood pressure and heart disease  High blood pressure causes heart disease and increases the risk of stroke. This is more likely to develop in people who have high blood pressure readings, are of African descent, or are overweight.  Have your blood pressure checked: ? Every 3-5 years if you are 18-39 years of age. ? Every year if you are 40 years old or older. Diabetes Have regular diabetes screenings. This checks your fasting blood sugar level. Have the screening done:  Once every three years after age 40 if you are at a normal weight and have a low risk for diabetes.  More often and at a younger age if you are overweight or have a high risk for diabetes. What should I know about preventing infection? Hepatitis B If you have a higher risk for hepatitis B, you should be screened for this virus. Talk with your health care provider to find out if you are at risk for hepatitis B infection. Hepatitis C Testing is recommended for:  Everyone born from 1945 through 1965.  Anyone with known risk factors for hepatitis C. Sexually transmitted   infections (STIs)  Get screened for STIs, including gonorrhea and chlamydia, if: ? You are sexually active and are younger than 72 years of age. ? You are older than 72 years of age and your health care provider tells you that you are at risk for this type of infection. ? Your sexual activity has changed since you were last screened, and you are at increased risk for chlamydia or gonorrhea. Ask your health care  provider if you are at risk.  Ask your health care provider about whether you are at high risk for HIV. Your health care provider may recommend a prescription medicine to help prevent HIV infection. If you choose to take medicine to prevent HIV, you should first get tested for HIV. You should then be tested every 3 months for as long as you are taking the medicine. Pregnancy  If you are about to stop having your period (premenopausal) and you may become pregnant, seek counseling before you get pregnant.  Take 400 to 800 micrograms (mcg) of folic acid every day if you become pregnant.  Ask for birth control (contraception) if you want to prevent pregnancy. Osteoporosis and menopause Osteoporosis is a disease in which the bones lose minerals and strength with aging. This can result in bone fractures. If you are 65 years old or older, or if you are at risk for osteoporosis and fractures, ask your health care provider if you should:  Be screened for bone loss.  Take a calcium or vitamin D supplement to lower your risk of fractures.  Be given hormone replacement therapy (HRT) to treat symptoms of menopause. Follow these instructions at home: Lifestyle  Do not use any products that contain nicotine or tobacco, such as cigarettes, e-cigarettes, and chewing tobacco. If you need help quitting, ask your health care provider.  Do not use street drugs.  Do not share needles.  Ask your health care provider for help if you need support or information about quitting drugs. Alcohol use  Do not drink alcohol if: ? Your health care provider tells you not to drink. ? You are pregnant, may be pregnant, or are planning to become pregnant.  If you drink alcohol: ? Limit how much you use to 0-1 drink a day. ? Limit intake if you are breastfeeding.  Be aware of how much alcohol is in your drink. In the [place] one drink equals one 12 oz bottle of beer (355 mL), one 5 oz glass of wine (148 mL), or one 1  oz glass of hard liquor (44 mL). General instructions  Schedule regular health, dental, and eye exams.  Stay current with your vaccines.  Tell your health care provider if: ? You often feel depressed. ? You have ever been abused or do not feel safe at home. Summary  Adopting a healthy lifestyle and getting preventive care are important in promoting health and wellness.  Follow your health care provider's instructions about healthy diet, exercising, and getting tested or screened for diseases.  Follow your health care provider's instructions on monitoring your cholesterol and blood pressure. This information is not intended to replace advice given to you by your health care provider. Make sure you discuss any questions you have with your health care provider. Document Released: 01/31/2011 Document Revised: 07/11/2018 Document Reviewed: 07/11/2018 Elsevier Patient Education  2020 [employer]  

## 2019-07-22 ENCOUNTER — Other Ambulatory Visit: Payer: Self-pay

## 2019-07-22 ENCOUNTER — Ambulatory Visit (INDEPENDENT_AMBULATORY_CARE_PROVIDER_SITE_OTHER): Payer: Medicare HMO | Admitting: Internal Medicine

## 2019-07-22 ENCOUNTER — Encounter: Payer: Self-pay | Admitting: Internal Medicine

## 2019-07-22 VITALS — BP 142/90 | HR 78 | Temp 98.4°F | Resp 16 | Ht 68.0 in | Wt 219.4 lb

## 2019-07-22 DIAGNOSIS — Z0000 Encounter for general adult medical examination without abnormal findings: Secondary | ICD-10-CM

## 2019-07-22 DIAGNOSIS — M85859 Other specified disorders of bone density and structure, unspecified thigh: Secondary | ICD-10-CM | POA: Diagnosis not present

## 2019-07-22 DIAGNOSIS — R2681 Unsteadiness on feet: Secondary | ICD-10-CM

## 2019-07-22 DIAGNOSIS — Z833 Family history of diabetes mellitus: Secondary | ICD-10-CM | POA: Diagnosis not present

## 2019-07-22 LAB — CBC WITH DIFFERENTIAL/PLATELET
Basophils Absolute: 0 10*3/uL (ref 0.0–0.1)
Basophils Relative: 0.4 % (ref 0.0–3.0)
Eosinophils Absolute: 0.1 10*3/uL (ref 0.0–0.7)
Eosinophils Relative: 1.1 % (ref 0.0–5.0)
HCT: 45.7 % (ref 36.0–46.0)
Hemoglobin: 15 g/dL (ref 12.0–15.0)
Lymphocytes Relative: 35.7 % (ref 12.0–46.0)
Lymphs Abs: 2.2 10*3/uL (ref 0.7–4.0)
MCHC: 32.8 g/dL (ref 30.0–36.0)
MCV: 88.9 fl (ref 78.0–100.0)
Monocytes Absolute: 0.4 10*3/uL (ref 0.1–1.0)
Monocytes Relative: 6.3 % (ref 3.0–12.0)
Neutro Abs: 3.4 10*3/uL (ref 1.4–7.7)
Neutrophils Relative %: 56.5 % (ref 43.0–77.0)
Platelets: 184 10*3/uL (ref 150.0–400.0)
RBC: 5.14 Mil/uL — ABNORMAL HIGH (ref 3.87–5.11)
RDW: 15.4 % (ref 11.5–15.5)
WBC: 6.1 10*3/uL (ref 4.0–10.5)

## 2019-07-22 LAB — HEMOGLOBIN A1C: Hgb A1c MFr Bld: 5.7 % (ref 4.6–6.5)

## 2019-07-22 LAB — COMPREHENSIVE METABOLIC PANEL
ALT: 20 U/L (ref 0–35)
AST: 33 U/L (ref 0–37)
Albumin: 4 g/dL (ref 3.5–5.2)
Alkaline Phosphatase: 89 U/L (ref 39–117)
BUN: 13 mg/dL (ref 6–23)
CO2: 27 mEq/L (ref 19–32)
Calcium: 9.4 mg/dL (ref 8.4–10.5)
Chloride: 106 mEq/L (ref 96–112)
Creatinine, Ser: 0.66 mg/dL (ref 0.40–1.20)
GFR: 87.95 mL/min (ref >60.00)
Glucose, Bld: 95 mg/dL (ref 70–99)
Potassium: 4.4 mEq/L (ref 3.5–5.1)
Sodium: 141 mEq/L (ref 135–145)
Total Bilirubin: 0.6 mg/dL (ref 0.2–1.2)
Total Protein: 7.4 g/dL (ref 6.0–8.3)

## 2019-07-22 LAB — LIPID PANEL
Cholesterol: 187 mg/dL (ref 0–200)
HDL: 44.1 mg/dL (ref >39.00)
LDL Cholesterol: 110 mg/dL — ABNORMAL HIGH (ref 0–99)
NonHDL: 143.25
Total CHOL/HDL Ratio: 4
Triglycerides: 165 mg/dL — ABNORMAL HIGH (ref 0.0–149.0)
VLDL: 33 mg/dL (ref 0.0–40.0)

## 2019-07-22 LAB — TSH: TSH: 1.52 u[IU]/mL (ref 0.35–4.50)

## 2019-07-22 NOTE — Assessment & Plan Note (Signed)
Uses a cane to ambulate, will use a walker if walking longer distances Encouraged regular exercise

## 2019-07-22 NOTE — Assessment & Plan Note (Signed)
dexa Encouraged regularly exercise Taking vitamin d daily

## 2019-07-22 NOTE — Assessment & Plan Note (Signed)
A1c

## 2019-07-27 ENCOUNTER — Encounter: Payer: Self-pay | Admitting: Internal Medicine

## 2019-07-27 DIAGNOSIS — R7303 Prediabetes: Secondary | ICD-10-CM | POA: Insufficient documentation

## 2019-08-28 ENCOUNTER — Ambulatory Visit: Payer: Medicare HMO

## 2019-09-18 ENCOUNTER — Ambulatory Visit: Payer: Medicare HMO

## 2020-04-30 DIAGNOSIS — H5212 Myopia, left eye: Secondary | ICD-10-CM | POA: Diagnosis not present

## 2020-06-18 DIAGNOSIS — H25813 Combined forms of age-related cataract, bilateral: Secondary | ICD-10-CM | POA: Diagnosis not present

## 2020-06-18 DIAGNOSIS — H353131 Nonexudative age-related macular degeneration, bilateral, early dry stage: Secondary | ICD-10-CM | POA: Diagnosis not present

## 2020-07-21 DIAGNOSIS — E781 Pure hyperglyceridemia: Secondary | ICD-10-CM | POA: Insufficient documentation

## 2020-07-21 NOTE — Progress Notes (Signed)
Subjective:    Patient ID: [name], female    DOB: March 25, 1946, 74 y.o.   MRN: [id number]   This visit occurred during the SARS-CoV-2 public health emergency.  Safety protocols were in place, including screening questions prior to the visit, additional usage of staff PPE, and extensive cleaning of exam room while observing appropriate contact time as indicated for disinfecting solutions.    HPI She is here for a physical exam.   She denies changes in her health.    Medications and allergies reviewed with patient and updated if appropriate.  Patient Active Problem List   Diagnosis Date Noted  . Hypertriglyceridemia 07/21/2020  . Prediabetes 07/27/2019  . Family history of diabetes mellitus in mother 07/21/2019  . Osteopenia 07/19/2018  . Pain of left heel 07/18/2018  . 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 Closed compression fracture of L1 vertebra ([facility]) 07/14/2016  . Gait instability 07/14/2016  . Back pain 07/08/2015  . Allergic rhinitis 06/02/2015  . Lumbar radiculopathy 06/02/2015    Current Outpatient Medications on File Prior to Visit  Medication Sig Dispense Refill  . Bioflavonoid Products (BIOFLEX PO) Take 1 capsule by mouth daily.     . cholecalciferol (VITAMIN D) 1000 units tablet Take 1 tablet (1,000 Units total) by mouth daily.     No current facility-administered medications on file prior to visit.    Past Medical History:  Diagnosis Date  . Arthritis    Hands and elbows  . GERD (gastroesophageal reflux disease)    tums prn  . Medical history non-contributory   . Seasonal allergies     Past Surgical History:  Procedure Laterality Date  . KYPHOPLASTY N/A 07/08/2015   Procedure: L1 KYPHOPLASTY;  Surgeon: Venita Lick, MD;  Location: [facility];  Service: Orthopedics;  Laterality: N/A;  . ORIF ANKLE FRACTURE Right 2003  . TRANSFORAMINAL LUMBAR INTERBODY FUSION (TLIF) WITH PEDICLE SCREW FIXATION 1 LEVEL N/A 07/08/2015   Procedure: L5 - S1  TRANSFORAMINAL LUMBAR INTERBODY FUSION (TLIF) WITH PEDICLE SCREW FIXATION 1 LEVEL;  Surgeon: Venita Lick, MD;  Location: [facility];  Service: Orthopedics;  Laterality: N/A;    Social History   Socioeconomic History  . Marital status: Married    Spouse name: Not on file  . Number of children: Not on file  . Years of education: Not on file  . Highest education level: Not on file  Occupational History  . Not on file  Tobacco Use  . Smoking status: Never Smoker  . Smokeless tobacco: Never Used  Substance and Sexual Activity  . Alcohol use: No  . Drug use: No  . Sexual activity: Not on file  Other Topics Concern  . Not on file  Social History Narrative  . Not on file   Social Determinants of Health   Financial Resource Strain: Not on file  Food Insecurity: Not on file  Transportation Needs: Not on file  Physical Activity: Not on file  Stress: Not on file  Social Connections: Not on file    Family History  Problem Relation Age of Onset  . Diabetes Mother   . Hypertension Mother   . Muscular dystrophy Father   . Hypotension Father   . Diabetes Maternal Grandmother     Review of Systems  Constitutional: Negative for chills and fever.  HENT: Positive for congestion (allergies).   Eyes: Negative for visual disturbance.  Respiratory: Positive for cough (with allergies). Negative for shortness of breath and wheezing.   Cardiovascular:  Negative for chest pain, palpitations and leg swelling.  Gastrointestinal: Negative for abdominal pain, blood in stool, constipation, diarrhea and nausea.       Rare gerd  Genitourinary: Negative for dysuria and hematuria.  Musculoskeletal: Negative for back pain.       Numbness left leg to toes  Skin: Negative for color change and rash.  Neurological: Positive for numbness (left leg) and headaches (sinus headaches, occ). Negative for dizziness and light-headedness.  Psychiatric/Behavioral: Negative for dysphoric mood and sleep disturbance.  The patient is not nervous/anxious.        Objective:   Vitals:   07/22/20 0901  BP: 138/68  Pulse: 60  Temp: 97.9 F (36.6 C)  SpO2: 93%   Filed Weights   07/22/20 0901  Weight: 208 lb (94.3 kg)   Body mass index is 31.63 kg/m.  BP Readings from Last 3 Encounters:  07/22/20 138/68  07/22/19 (!) 142/90  07/18/18 136/82    Wt Readings from Last 3 Encounters:  07/22/20 208 lb (94.3 kg)  07/22/19 219 lb 6.4 oz (99.5 kg)  07/18/18 214 lb (97.1 kg)     Physical Exam Constitutional: She appears well-developed and well-nourished. No distress.  HENT:  Head: Normocephalic and atraumatic.  Right Ear: External ear normal. Normal ear canal and TM Left Ear: External ear normal.  Normal ear canal and TM Mouth/Throat: Oropharynx is clear and moist.  Eyes: Conjunctivae and EOM are normal.  Neck: Neck supple. No tracheal deviation present. No thyromegaly present.  No carotid bruit  Cardiovascular: Normal rate, regular rhythm and normal heart sounds.   No murmur heard.  No edema. Pulmonary/Chest: Effort normal and breath sounds normal. No respiratory distress. She has no wheezes. She has no rales.  Breast: deferred   Abdominal: Soft. She exhibits no distension. There is no tenderness.  MSK: kyphosis, walks bent over Lymphadenopathy: She has no cervical adenopathy.  Skin: Skin is warm and dry. She is not diaphoretic.  Psychiatric: She has a normal mood and affect. Her behavior is normal.        Assessment & Plan:   Physical exam: Screening blood work    ordered Immunizations  had covid 19 vaccines, flu vac done, discussed shingrix Cologuard - will have sent to her  Mammogram  Has not had one in a while. Gyn  n/a Dexa  Up to date  Eye exams  Up to date  - to have cataract surgery next month - L eye Exercise  None Weight   Has lost weight  Substance abuse  none      See Problem List for Assessment and Plan of chronic medical problems.

## 2020-07-21 NOTE — Patient Instructions (Addendum)
Blood work was ordered.     No immunization administered today.   Medications changes include :   none    Please followup in 1 year    Health Maintenance, Female Adopting a healthy lifestyle and getting preventive care are important in promoting health and wellness. Ask your health care provider about:  The right schedule for you to have regular tests and exams.  Things you can do on your own to prevent diseases and keep yourself healthy. What should I know about diet, weight, and exercise? Eat a healthy diet   Eat a diet that includes plenty of vegetables, fruits, low-fat dairy products, and lean protein.  Do not eat a lot of foods that are high in solid fats, added sugars, or sodium. Maintain a healthy weight Body mass index (BMI) is used to identify weight problems. It estimates body fat based on height and weight. Your health care provider can help determine your BMI and help you achieve or maintain a healthy weight. Get regular exercise Get regular exercise. This is one of the most important things you can do for your health. Most adults should:  Exercise for at least 150 minutes each week. The exercise should increase your heart rate and make you sweat (moderate-intensity exercise).  Do strengthening exercises at least twice a week. This is in addition to the moderate-intensity exercise.  Spend less time sitting. Even light physical activity can be beneficial. Watch cholesterol and blood lipids Have your blood tested for lipids and cholesterol at 73 years of age, then have this test every 5 years. Have your cholesterol levels checked more often if:  Your lipid or cholesterol levels are high.  You are older than 73 years of age.  You are at high risk for heart disease. What should I know about cancer screening? Depending on your health history and family history, you may need to have cancer screening at various ages. This may include screening for:  Breast  cancer.  Cervical cancer.  Colorectal cancer.  Skin cancer.  Lung cancer. What should I know about heart disease, diabetes, and high blood pressure? Blood pressure and heart disease  High blood pressure causes heart disease and increases the risk of stroke. This is more likely to develop in people who have high blood pressure readings, are of African descent, or are overweight.  Have your blood pressure checked: ? Every 3-5 years if you are 18-39 years of age. ? Every year if you are 40 years old or older. Diabetes Have regular diabetes screenings. This checks your fasting blood sugar level. Have the screening done:  Once every three years after age 40 if you are at a normal weight and have a low risk for diabetes.  More often and at a younger age if you are overweight or have a high risk for diabetes. What should I know about preventing infection? Hepatitis B If you have a higher risk for hepatitis B, you should be screened for this virus. Talk with your health care provider to find out if you are at risk for hepatitis B infection. Hepatitis C Testing is recommended for:  Everyone born from 1945 through 1965.  Anyone with known risk factors for hepatitis C. Sexually transmitted infections (STIs)  Get screened for STIs, including gonorrhea and chlamydia, if: ? You are sexually active and are younger than 73 years of age. ? You are older than 73 years of age and your health care provider tells you that you are at risk   for this type of infection. ? Your sexual activity has changed since you were last screened, and you are at increased risk for chlamydia or gonorrhea. Ask your health care provider if you are at risk.  Ask your health care provider about whether you are at high risk for HIV. Your health care provider may recommend a prescription medicine to help prevent HIV infection. If you choose to take medicine to prevent HIV, you should first get tested for HIV. You should  then be tested every 3 months for as long as you are taking the medicine. Pregnancy  If you are about to stop having your period (premenopausal) and you may become pregnant, seek counseling before you get pregnant.  Take 400 to 800 micrograms (mcg) of folic acid every day if you become pregnant.  Ask for birth control (contraception) if you want to prevent pregnancy. Osteoporosis and menopause Osteoporosis is a disease in which the bones lose minerals and strength with aging. This can result in bone fractures. If you are 65 years old or older, or if you are at risk for osteoporosis and fractures, ask your health care provider if you should:  Be screened for bone loss.  Take a calcium or vitamin D supplement to lower your risk of fractures.  Be given hormone replacement therapy (HRT) to treat symptoms of menopause. Follow these instructions at home: Lifestyle  Do not use any products that contain nicotine or tobacco, such as cigarettes, e-cigarettes, and chewing tobacco. If you need help quitting, ask your health care provider.  Do not use street drugs.  Do not share needles.  Ask your health care provider for help if you need support or information about quitting drugs. Alcohol use  Do not drink alcohol if: ? Your health care provider tells you not to drink. ? You are pregnant, may be pregnant, or are planning to become pregnant.  If you drink alcohol: ? Limit how much you use to 0-1 drink a day. ? Limit intake if you are breastfeeding.  Be aware of how much alcohol is in your drink. In the [place] one drink equals one 12 oz bottle of beer (355 mL), one 5 oz glass of wine (148 mL), or one 1 oz glass of hard liquor (44 mL). General instructions  Schedule regular health, dental, and eye exams.  Stay current with your vaccines.  Tell your health care provider if: ? You often feel depressed. ? You have ever been abused or do not feel safe at home. Summary  Adopting a  healthy lifestyle and getting preventive care are important in promoting health and wellness.  Follow your health care provider's instructions about healthy diet, exercising, and getting tested or screened for diseases.  Follow your health care provider's instructions on monitoring your cholesterol and blood pressure. This information is not intended to replace advice given to you by your health care provider. Make sure you discuss any questions you have with your health care provider. Document Revised: 07/11/2018 Document Reviewed: 07/11/2018 Elsevier Patient Education  2020 [employer]  

## 2020-07-22 ENCOUNTER — Ambulatory Visit (INDEPENDENT_AMBULATORY_CARE_PROVIDER_SITE_OTHER): Payer: Medicare HMO | Admitting: Internal Medicine

## 2020-07-22 ENCOUNTER — Other Ambulatory Visit: Payer: Self-pay

## 2020-07-22 ENCOUNTER — Encounter: Payer: Self-pay | Admitting: Internal Medicine

## 2020-07-22 VITALS — BP 138/68 | HR 60 | Temp 97.9°F | Ht 68.0 in | Wt 208.0 lb

## 2020-07-22 DIAGNOSIS — Z0000 Encounter for general adult medical examination without abnormal findings: Secondary | ICD-10-CM

## 2020-07-22 DIAGNOSIS — R7303 Prediabetes: Secondary | ICD-10-CM

## 2020-07-22 DIAGNOSIS — E781 Pure hyperglyceridemia: Secondary | ICD-10-CM | POA: Diagnosis not present

## 2020-07-22 DIAGNOSIS — M85859 Other specified disorders of bone density and structure, unspecified thigh: Secondary | ICD-10-CM

## 2020-07-22 LAB — COMPREHENSIVE METABOLIC PANEL
ALT: 18 U/L (ref 0–35)
AST: 30 U/L (ref 0–37)
Albumin: 4 g/dL (ref 3.5–5.2)
Alkaline Phosphatase: 91 U/L (ref 39–117)
BUN: 13 mg/dL (ref 6–23)
CO2: 26 mEq/L (ref 19–32)
Calcium: 9.4 mg/dL (ref 8.4–10.5)
Chloride: 107 mEq/L (ref 96–112)
Creatinine, Ser: 0.67 mg/dL (ref 0.40–1.20)
GFR: 86.75 mL/min (ref >60.00)
Glucose, Bld: 93 mg/dL (ref 70–99)
Potassium: 4 mEq/L (ref 3.5–5.1)
Sodium: 139 mEq/L (ref 135–145)
Total Bilirubin: 0.9 mg/dL (ref 0.2–1.2)
Total Protein: 7.9 g/dL (ref 6.0–8.3)

## 2020-07-22 LAB — CBC WITH DIFFERENTIAL/PLATELET
Basophils Absolute: 0 10*3/uL (ref 0.0–0.1)
Basophils Relative: 0.5 % (ref 0.0–3.0)
Eosinophils Absolute: 0.1 10*3/uL (ref 0.0–0.7)
Eosinophils Relative: 1.1 % (ref 0.0–5.0)
HCT: 44.5 % (ref 36.0–46.0)
Hemoglobin: 14.7 g/dL (ref 12.0–15.0)
Lymphocytes Relative: 36.3 % (ref 12.0–46.0)
Lymphs Abs: 2.4 10*3/uL (ref 0.7–4.0)
MCHC: 33.1 g/dL (ref 30.0–36.0)
MCV: 89.2 fl (ref 78.0–100.0)
Monocytes Absolute: 0.4 10*3/uL (ref 0.1–1.0)
Monocytes Relative: 6.1 % (ref 3.0–12.0)
Neutro Abs: 3.7 10*3/uL (ref 1.4–7.7)
Neutrophils Relative %: 56 % (ref 43.0–77.0)
Platelets: 193 10*3/uL (ref 150.0–400.0)
RBC: 4.99 Mil/uL (ref 3.87–5.11)
RDW: 15.5 % (ref 11.5–15.5)
WBC: 6.5 10*3/uL (ref 4.0–10.5)

## 2020-07-22 LAB — TSH: TSH: 1.6 u[IU]/mL (ref 0.35–4.50)

## 2020-07-22 LAB — LIPID PANEL
Cholesterol: 164 mg/dL (ref 0–200)
HDL: 47.5 mg/dL (ref >39.00)
LDL Cholesterol: 89 mg/dL (ref 0–99)
NonHDL: 116.15
Total CHOL/HDL Ratio: 3
Triglycerides: 135 mg/dL (ref 0.0–149.0)
VLDL: 27 mg/dL (ref 0.0–40.0)

## 2020-07-22 LAB — HEMOGLOBIN A1C: Hgb A1c MFr Bld: 5.5 % (ref 4.6–6.5)

## 2020-07-22 NOTE — Assessment & Plan Note (Addendum)
Chronic dexa up to date Taking vitamin d daily

## 2020-07-22 NOTE — Assessment & Plan Note (Signed)
Chronic Check a1c Low sugar / carb diet Stressed regular exercise  

## 2020-07-22 NOTE — Addendum Note (Signed)
Addended by: Evert Kohl D on: 07/22/2020 09:33 AM   Modules accepted: Orders

## 2020-07-22 NOTE — Assessment & Plan Note (Signed)
Chronic Check lipids Healthy diet, stay active

## 2020-08-07 ENCOUNTER — Other Ambulatory Visit: Payer: Self-pay

## 2020-08-07 DIAGNOSIS — Z1211 Encounter for screening for malignant neoplasm of colon: Secondary | ICD-10-CM

## 2020-09-29 DIAGNOSIS — H353131 Nonexudative age-related macular degeneration, bilateral, early dry stage: Secondary | ICD-10-CM | POA: Diagnosis not present

## 2020-09-29 DIAGNOSIS — H25813 Combined forms of age-related cataract, bilateral: Secondary | ICD-10-CM | POA: Diagnosis not present

## 2020-10-16 DIAGNOSIS — H21562 Pupillary abnormality, left eye: Secondary | ICD-10-CM | POA: Diagnosis not present

## 2020-10-16 DIAGNOSIS — H25812 Combined forms of age-related cataract, left eye: Secondary | ICD-10-CM | POA: Diagnosis not present

## 2021-04-08 ENCOUNTER — Telehealth: Payer: Self-pay | Admitting: Internal Medicine

## 2021-04-08 NOTE — Telephone Encounter (Signed)
Left message for patient to call me back at [phone] to schedule Medicare Annual Wellness Visit   No hx of AWV eligible as of 07/01/18  Please schedule at anytime with [facility] if patient calls the office back.    40 Minutes appointment   Any questions, please call me at [phone]  

## 2021-04-23 ENCOUNTER — Ambulatory Visit (INDEPENDENT_AMBULATORY_CARE_PROVIDER_SITE_OTHER): Payer: Medicare HMO

## 2021-04-23 ENCOUNTER — Other Ambulatory Visit: Payer: Self-pay

## 2021-04-23 DIAGNOSIS — Z0000 Encounter for general adult medical examination without abnormal findings: Secondary | ICD-10-CM | POA: Diagnosis not present

## 2021-04-23 NOTE — Patient Instructions (Addendum)
[name] , Thank you for taking time to come for your Medicare Wellness Visit. I appreciate your ongoing commitment to your health goals. Please review the following plan we discussed and let me know if I can assist you in the future.   Screening recommendations/referrals: Colonoscopy: declined Mammogram: declined Bone Density: 07/18/2018; due every 3 years (due 07/18/2021) Recommended yearly ophthalmology/optometry visit for glaucoma screening and checkup Recommended yearly dental visit for hygiene and checkup  Vaccinations: Influenza vaccine: 04/06/2021 Pneumococcal vaccine: 06/02/2015, 07/14/2016 Tdap vaccine: 07/14/2016; due every 10 years (due 07/14/2026) Shingles vaccine: never done; can check with local pharmacy for vaccine and price.   Covid-19: 08/31/2019, 09/30/2019, 05/30/2020  Advanced directives: Please bring a copy of your health care power of attorney and living will to the office at your convenience.  Conditions/risks identified: Yes; Client understands the importance of follow-up with providers by attending scheduled visits and discussed goals to eat healthier, increase physical activity, exercise the brain, socialize more, get enough sleep and make time for laughter.   Next appointment: 03/25/2022 at 9:20 am Phone Visit.   Preventive Care 42 Years and Older, Female Preventive care refers to lifestyle choices and visits with your health care provider that can promote health and wellness. What does preventive care include? A yearly physical exam. This is also called an annual well check. Dental exams once or twice a year. Routine eye exams. Ask your health care provider how often you should have your eyes checked. Personal lifestyle choices, including: Daily care of your teeth and gums. Regular physical activity. Eating a healthy diet. Avoiding tobacco and drug use. Limiting alcohol use. Practicing safe sex. Taking low-dose aspirin every day. Taking vitamin and  mineral supplements as recommended by your health care provider. What happens during an annual well check? The services and screenings done by your health care provider during your annual well check will depend on your age, overall health, lifestyle risk factors, and family history of disease. Counseling  Your health care provider may ask you questions about your: Alcohol use. Tobacco use. Drug use. Emotional well-being. Home and relationship well-being. Sexual activity. Eating habits. History of falls. Memory and ability to understand (cognition). Work and work Astronomer. Reproductive health. Screening  You may have the following tests or measurements: Height, weight, and BMI. Blood pressure. Lipid and cholesterol levels. These may be checked every 5 years, or more frequently if you are over 52 years old. Skin check. Lung cancer screening. You may have this screening every year starting at age 42 if you have a 30-pack-year history of smoking and currently smoke or have quit within the past 15 years. Fecal occult blood test (FOBT) of the stool. You may have this test every year starting at age 73. Flexible sigmoidoscopy or colonoscopy. You may have a sigmoidoscopy every 5 years or a colonoscopy every 10 years starting at age 46. Hepatitis C blood test. Hepatitis B blood test. Sexually transmitted disease (STD) testing. Diabetes screening. This is done by checking your blood sugar (glucose) after you have not eaten for a while (fasting). You may have this done every 1-3 years. Bone density scan. This is done to screen for osteoporosis. You may have this done starting at age 58. Mammogram. This may be done every 1-2 years. Talk to your health care provider about how often you should have regular mammograms. Talk with your health care provider about your test results, treatment options, and if necessary, the need for more tests. Vaccines  Your health care  provider may recommend  certain vaccines, such as: Influenza vaccine. This is recommended every year. Tetanus, diphtheria, and acellular pertussis (Tdap, Td) vaccine. You may need a Td booster every 10 years. Zoster vaccine. You may need this after age 48. Pneumococcal 13-valent conjugate (PCV13) vaccine. One dose is recommended after age 84. Pneumococcal polysaccharide (PPSV23) vaccine. One dose is recommended after age 37. Talk to your health care provider about which screenings and vaccines you need and how often you need them. This information is not intended to replace advice given to you by your health care provider. Make sure you discuss any questions you have with your health care provider. Document Released: 08/14/2015 Document Revised: 04/06/2016 Document Reviewed: 05/19/2015 Elsevier Interactive Patient Education  2017 [employer] Prevention in the Home Falls can cause injuries. They can happen to people of all ages. There are many things you can do to make your home safe and to help prevent falls. What can I do on the outside of my home? Regularly fix the edges of walkways and driveways and fix any cracks. Remove anything that might make you trip as you walk through a door, such as a raised step or threshold. Trim any bushes or trees on the path to your home. Use bright outdoor lighting. Clear any walking paths of anything that might make someone trip, such as rocks or tools. Regularly check to see if handrails are loose or broken. Make sure that both sides of any steps have handrails. Any raised decks and porches should have guardrails on the edges. Have any leaves, snow, or ice cleared regularly. Use sand or salt on walking paths during winter. Clean up any spills in your garage right away. This includes oil or grease spills. What can I do in the bathroom? Use night lights. Install grab bars by the toilet and in the tub and shower. Do not use towel bars as grab bars. Use non-skid mats or  decals in the tub or shower. If you need to sit down in the shower, use a plastic, non-slip stool. Keep the floor dry. Clean up any water that spills on the floor as soon as it happens. Remove soap buildup in the tub or shower regularly. Attach bath mats securely with double-sided non-slip rug tape. Do not have throw rugs and other things on the floor that can make you trip. What can I do in the bedroom? Use night lights. Make sure that you have a light by your bed that is easy to reach. Do not use any sheets or blankets that are too big for your bed. They should not hang down onto the floor. Have a firm chair that has side arms. You can use this for support while you get dressed. Do not have throw rugs and other things on the floor that can make you trip. What can I do in the kitchen? Clean up any spills right away. Avoid walking on wet floors. Keep items that you use a lot in easy-to-reach places. If you need to reach something above you, use a strong step stool that has a grab bar. Keep electrical cords out of the way. Do not use floor polish or wax that makes floors slippery. If you must use wax, use non-skid floor wax. Do not have throw rugs and other things on the floor that can make you trip. What can I do with my stairs? Do not leave any items on the stairs. Make sure that there are handrails on both  sides of the stairs and use them. Fix handrails that are broken or loose. Make sure that handrails are as long as the stairways. Check any carpeting to make sure that it is firmly attached to the stairs. Fix any carpet that is loose or worn. Avoid having throw rugs at the top or bottom of the stairs. If you do have throw rugs, attach them to the floor with carpet tape. Make sure that you have a light switch at the top of the stairs and the bottom of the stairs. If you do not have them, ask someone to add them for you. What else can I do to help prevent falls? Wear shoes that: Do not  have high heels. Have rubber bottoms. Are comfortable and fit you well. Are closed at the toe. Do not wear sandals. If you use a stepladder: Make sure that it is fully opened. Do not climb a closed stepladder. Make sure that both sides of the stepladder are locked into place. Ask someone to hold it for you, if possible. Clearly mark and make sure that you can see: Any grab bars or handrails. First and last steps. Where the edge of each step is. Use tools that help you move around (mobility aids) if they are needed. These include: Canes. Walkers. Scooters. Crutches. Turn on the lights when you go into a dark area. Replace any light bulbs as soon as they burn out. Set up your furniture so you have a clear path. Avoid moving your furniture around. If any of your floors are uneven, fix them. If there are any pets around you, be aware of where they are. Review your medicines with your doctor. Some medicines can make you feel dizzy. This can increase your chance of falling. Ask your doctor what other things that you can do to help prevent falls. This information is not intended to replace advice given to you by your health care provider. Make sure you discuss any questions you have with your health care provider. Document Released: 05/14/2009 Document Revised: 12/24/2015 Document Reviewed: 08/22/2014 Elsevier Interactive Patient Education  2017 [employer].

## 2021-04-23 NOTE — Progress Notes (Signed)
I connected with [name] today by telephone and verified that I am speaking with the correct person using two identifiers. Location patient: home Location provider: work Persons participating in the virtual visit: patient, provider.   I discussed the limitations, risks, security and privacy concerns of performing an evaluation and management service by telephone and the availability of in person appointments. I also discussed with the patient that there may be a patient responsible charge related to this service. The patient expressed understanding and verbally consented to this telephonic visit.    Interactive audio and video telecommunications were attempted between this provider and patient, however failed, due to patient having technical difficulties OR patient did not have access to video capability.  We continued and completed visit with audio only.  Some vital signs may be absent or patient reported.   Time Spent with patient on telephone encounter: 40 minutes  Subjective:   [name] is a 75 y.o. female who presents for an Initial Medicare Annual Wellness Visit.  Review of Systems     Cardiac Risk Factors include: advanced age (>63men, >27 women);family history of premature cardiovascular disease     Objective:    There were no vitals filed for this visit. There is no height or weight on file to calculate BMI.  Advanced Directives 04/23/2021 07/09/2015 07/06/2015  Does Patient Have a Medical Advance Directive? Yes No No  Type of Advance Directive Living will - -  Does patient want to make changes to medical advance directive? No - Patient declined - -  Would patient like information on creating a medical advance directive? - Yes - Educational materials given Yes - Transport planner given    Current Medications (verified) Outpatient Encounter Medications as of 04/23/2021  Medication Sig   Bioflavonoid Products (BIOFLEX PO) Take 1 capsule by mouth daily.     cholecalciferol (VITAMIN D) 1000 units tablet Take 1 tablet (1,000 Units total) by mouth daily.   No facility-administered encounter medications on file as of 04/23/2021.    Allergies (verified) Patient has no known allergies.   History: Past Medical History:  Diagnosis Date   Arthritis    Hands and elbows   GERD (gastroesophageal reflux disease)    tums prn   Medical history non-contributory    Seasonal allergies    Past Surgical History:  Procedure Laterality Date   KYPHOPLASTY N/A 07/08/2015   Procedure: L1 KYPHOPLASTY;  Surgeon: Venita Lick, MD;  Location: [facility] OR;  Service: Orthopedics;  Laterality: N/A;   ORIF ANKLE FRACTURE Right 2003   TRANSFORAMINAL LUMBAR INTERBODY FUSION (TLIF) WITH PEDICLE SCREW FIXATION 1 LEVEL N/A 07/08/2015   Procedure: L5 - S1 TRANSFORAMINAL LUMBAR INTERBODY FUSION (TLIF) WITH PEDICLE SCREW FIXATION 1 LEVEL;  Surgeon: Venita Lick, MD;  Location: [facility] OR;  Service: Orthopedics;  Laterality: N/A;   Family History  Problem Relation Age of Onset   Diabetes Mother    Hypertension Mother    Muscular dystrophy Father    Hypotension Father    Diabetes Maternal Grandmother    Social History   Socioeconomic History   Marital status: Married    Spouse name: Not on file   Number of children: Not on file   Years of education: Not on file   Highest education level: Not on file  Occupational History   Not on file  Tobacco Use   Smoking status: Never   Smokeless tobacco: Never  Substance and Sexual Activity   Alcohol use: No   Drug use:  No   Sexual activity: Not on file  Other Topics Concern   Not on file  Social History Narrative   Not on file   Social Determinants of Health   Financial Resource Strain: Low Risk    Difficulty of Paying Living Expenses: Not hard at all  Food Insecurity: No Food Insecurity   Worried About Running Out of Food in the Last Year: Never true   Ran Out of Food in the Last Year: Never true  Transportation Needs:  No Transportation Needs   Lack of Transportation (Medical): No   Lack of Transportation (Non-Medical): No  Physical Activity: Inactive   Days of Exercise per Week: 0 days   Minutes of Exercise per Session: 0 min  Stress: No Stress Concern Present   Feeling of Stress : Not at all  Social Connections: Socially Integrated   Frequency of Communication with Friends and Family: More than three times a week   Frequency of Social Gatherings with Friends and Family: More than three times a week   Attends Religious Services: More than 4 times per year   Active Member of [employer] or Organizations: Yes   Attends Engineer, structural: More than 4 times per year   Marital Status: Married    Tobacco Counseling Counseling given: Not Answered   Clinical Intake:  Pre-visit preparation completed: Yes  Pain : No/denies pain     Nutritional Risks: None Diabetes: No  How often do you need to have someone help you when you read instructions, pamphlets, or other written materials from your doctor or pharmacy?: 1 - Never What is the last grade level you completed in school?: High School Graduate  Diabetic? no  Interpreter Needed?: No  Information entered by :: Susie Cassette, LPN.   Activities of Daily Living In your present state of health, do you have any difficulty performing the following activities: 04/23/2021 07/22/2020  Hearing? N N  Vision? N N  Difficulty concentrating or making decisions? N N  Walking or climbing stairs? N Y  Dressing or bathing? N N  Doing errands, shopping? N N  Preparing Food and eating ? N -  Using the Toilet? N -  In the past six months, have you accidently leaked urine? N -  Do you have problems with loss of bowel control? N -  Managing your Medications? N -  Managing your Finances? N -  Housekeeping or managing your Housekeeping? N -  Some recent data might be hidden    Patient Care Team: Pincus Sanes, MD as PCP - General (Internal  Medicine)  Indicate any recent Medical Services you may have received from other than [facility] providers in the past year (date may be approximate).     Assessment:   This is a routine wellness examination for [facility].  Hearing/Vision screen Hearing Screening - Comments:: Patient denied any hearing difficulty.   No hearing aids.  Vision Screening - Comments:: Patient wears corrective glasses.  Eye exam done annually by: Seth Bake, OD with [facility].  Dietary issues and exercise activities discussed: Current Exercise Habits: The patient does not participate in regular exercise at present, Exercise limited by: Other - see comments (unsteady balance/gait)   Goals Addressed   None   Depression Screen PHQ 2/9 Scores 04/23/2021 07/22/2020 07/22/2019 07/17/2017 07/14/2016  PHQ - 2 Score 0 0 0 0 0    Fall Risk Fall Risk  04/23/2021 07/22/2020 07/22/2019 07/17/2017 07/14/2016  Falls in the past year? 0 1  0 No No  Number falls in past yr: 0 0 0 - -  Injury with Fall? 0 0 - - -  Risk for fall due to : No Fall Risks No Fall Risks - - -  Follow up Falls evaluation completed Falls evaluation completed - - -    FALL RISK PREVENTION PERTAINING TO THE HOME:  Any stairs in or around the home? No  If so, are there any without handrails? No  Home free of loose throw rugs in walkways, pet beds, electrical cords, etc? Yes  Adequate lighting in your home to reduce risk of falls? Yes   ASSISTIVE DEVICES UTILIZED TO PREVENT FALLS:  Life alert? No  Use of a cane, walker or w/c? Yes  Grab bars in the bathroom? Yes  Shower chair or bench in shower? Yes  Elevated toilet seat or a handicapped toilet? Yes   TIMED UP AND GO:  Was the test performed? No .  Length of time to ambulate 10 feet: n/a sec.   Gait slow and steady without use of assistive device (per patient)  Cognitive Function:Normal cognitive status assessed by direct observation by this Nurse Health Advisor. No abnormalities found.   No flowsheet data found.         Immunizations Immunization History  Administered Date(s) Administered   Fluad Quad(high Dose 65+) 04/02/2019, 04/06/2021   Influenza, High Dose Seasonal PF 04/08/2020   Influenza-Unspecified 05/11/2015, 04/01/2016, 04/19/2016, 04/10/2017   Moderna Sars-Covid-2 Vaccination 08/31/2019, 09/30/2019, 05/30/2020   Pneumococcal Conjugate-13 06/02/2015   Pneumococcal Polysaccharide-23 07/14/2016   Tdap 07/14/2016    TDAP status: Up to date  Flu Vaccine status: Up to date  Pneumococcal vaccine status: Up to date  Covid-19 vaccine status: Completed vaccines  Qualifies for Shingles Vaccine? Yes   Zostavax completed No   Shingrix Completed?: No.    Education has been provided regarding the importance of this vaccine. Patient has been advised to call insurance company to determine out of pocket expense if they have not yet received this vaccine. Advised may also receive vaccine at local pharmacy or Health Dept. Verbalized acceptance and understanding.  Screening Tests Health Maintenance  Topic Date Due   COLONOSCOPY (Pts 45-35yrs Insurance coverage will need to be confirmed)  Never done   MAMMOGRAM  Never done   Zoster Vaccines- Shingrix (1 of 2) Never done   COVID-19 Vaccine (4 - Booster for Moderna series) 09/28/2020   DEXA SCAN  07/18/2021   TETANUS/TDAP  07/14/2026   INFLUENZA VACCINE  Completed   Hepatitis C Screening  Completed   HPV VACCINES  Aged Out    Health Maintenance  Health Maintenance Due  Topic Date Due   COLONOSCOPY (Pts 45-40yrs Insurance coverage will need to be confirmed)  Never done   MAMMOGRAM  Never done   Zoster Vaccines- Shingrix (1 of 2) Never done   COVID-19 Vaccine (4 - Booster for Moderna series) 09/28/2020    Colorectal cancer screening: patient declined  Mammogram status: No longer required due to patient declined.  Bone Density status: Completed 07/18/2018. Results reflect: Bone density results:  OSTEOPENIA. Repeat every 3 years.  Lung Cancer Screening: (Low Dose CT Chest recommended if Age 42-80 years, 30 pack-year currently smoking OR have quit w/in 15years.) does not qualify.   Lung Cancer Screening Referral: no  Additional Screening:  Hepatitis C Screening: does qualify; Completed: yes  Vision Screening: Recommended annual ophthalmology exams for early detection of glaucoma and other disorders of the eye. Is the patient  up to date with their annual eye exam?  Yes  Who is the provider or what is the name of the office in which the patient attends annual eye exams? Seth Bake, OD with [facility] If pt is not established with a provider, would they like to be referred to a provider to establish care? No .   Dental Screening: Recommended annual dental exams for proper oral hygiene  Community Resource Referral / Chronic Care Management: CRR required this visit?  No   CCM required this visit?  No      Plan:     I have personally reviewed and noted the following in the patient's chart:   Medical and social history Use of alcohol, tobacco or illicit drugs  Current medications and supplements including opioid prescriptions. Patient is not currently taking opioid prescriptions. Functional ability and status Nutritional status Physical activity Advanced directives List of other physicians Hospitalizations, surgeries, and ER visits in previous 12 months Vitals Screenings to include cognitive, depression, and falls Referrals and appointments  In addition, I have reviewed and discussed with patient certain preventive protocols, quality metrics, and best practice recommendations. A written personalized care plan for preventive services as well as general preventive health recommendations were provided to patient.     Mickeal Needy, LPN   09/10/4707   Nurse Notes:  Patient is cogitatively intact. There were no vitals filed for this visit. There is no height or  weight on file to calculate BMI.

## 2021-07-27 ENCOUNTER — Encounter: Payer: Medicare HMO | Admitting: Internal Medicine

## 2021-08-22 ENCOUNTER — Encounter: Payer: Self-pay | Admitting: Internal Medicine

## 2021-08-22 NOTE — Progress Notes (Signed)
Subjective:    Patient ID: [name], female    DOB: May 06, 1946, 76 y.o.   MRN: [id number]   This visit occurred during the SARS-CoV-2 public health emergency.  Safety protocols were in place, including screening questions prior to the visit, additional usage of staff PPE, and extensive cleaning of exam room while observing appropriate contact time as indicated for disinfecting solutions.    HPI She is here for a physical exam.     Medications and allergies reviewed with patient and updated if appropriate.  Patient Active Problem List   Diagnosis Date Noted   Vitamin D deficiency 08/23/2021   Hypertriglyceridemia 07/21/2020   Prediabetes 07/27/2019   Family history of diabetes mellitus in mother 07/21/2019   Osteopenia 07/19/2018   Closed compression fracture of L1 vertebra (HCC) 07/14/2016   Gait instability 07/14/2016   Allergic rhinitis 06/02/2015   Lumbar radiculopathy 06/02/2015    Current Outpatient Medications on File Prior to Visit  Medication Sig Dispense Refill   Bioflavonoid Products (BIOFLEX PO) Take 1 capsule by mouth daily.      cholecalciferol (VITAMIN D) 1000 units tablet Take 1 tablet (1,000 Units total) by mouth daily.     No current facility-administered medications on file prior to visit.    Past Medical History:  Diagnosis Date   Arthritis    Hands and elbows   GERD (gastroesophageal reflux disease)    tums prn   Medical history non-contributory    Seasonal allergies     Past Surgical History:  Procedure Laterality Date   KYPHOPLASTY N/A 07/08/2015   Procedure: L1 KYPHOPLASTY;  Surgeon: Venita Lickahari Brooks, MD;  Location: [facility] OR;  Service: Orthopedics;  Laterality: N/A;   ORIF ANKLE FRACTURE Right 2003   TRANSFORAMINAL LUMBAR INTERBODY FUSION (TLIF) WITH PEDICLE SCREW FIXATION 1 LEVEL N/A 07/08/2015   Procedure: L5 - S1 TRANSFORAMINAL LUMBAR INTERBODY FUSION (TLIF) WITH PEDICLE SCREW FIXATION 1 LEVEL;  Surgeon: Venita Lickahari Brooks, MD;   Location: [facility] OR;  Service: Orthopedics;  Laterality: N/A;    Social History   Socioeconomic History   Marital status: Married    Spouse name: Not on file   Number of children: Not on file   Years of education: Not on file   Highest education level: Not on file  Occupational History   Not on file  Tobacco Use   Smoking status: Never   Smokeless tobacco: Never  Substance and Sexual Activity   Alcohol use: No   Drug use: No   Sexual activity: Not on file  Other Topics Concern   Not on file  Social History Narrative   Not on file   Social Determinants of Health   Financial Resource Strain: Low Risk    Difficulty of Paying Living Expenses: Not hard at all  Food Insecurity: No Food Insecurity   Worried About Running Out of Food in the Last Year: Never true   Ran Out of Food in the Last Year: Never true  Transportation Needs: No Transportation Needs   Lack of Transportation (Medical): No   Lack of Transportation (Non-Medical): No  Physical Activity: Inactive   Days of Exercise per Week: 0 days   Minutes of Exercise per Session: 0 min  Stress: No Stress Concern Present   Feeling of Stress : Not at all  Social Connections: Socially Integrated   Frequency of Communication with Friends and Family: More than three times a week   Frequency of Social Gatherings with Friends and Family: More than  three times a week   Attends Religious Services: More than 4 times per year   Active Member of Clubs or Organizations: Yes   Attends Music therapist: More than 4 times per year   Marital Status: Married    Family History  Problem Relation Age of Onset   Diabetes Mother    Hypertension Mother    Muscular dystrophy Father    Hypotension Father    Diabetes Maternal Grandmother     Review of Systems  Constitutional:  Negative for chills and fever.  Eyes:  Negative for visual disturbance.  Respiratory:  Positive for cough (allergy related). Negative for shortness of  breath and wheezing.   Cardiovascular:  Negative for chest pain, palpitations and leg swelling.  Gastrointestinal:  Negative for abdominal pain, blood in stool, constipation, diarrhea and nausea.       Occ gerd  Genitourinary:  Negative for dysuria.  Musculoskeletal:  Negative for arthralgias and back pain.  Skin:  Negative for rash.  Neurological:  Positive for numbness (chronic left left). Negative for light-headedness and headaches.  Psychiatric/Behavioral:  Negative for dysphoric mood. The patient is not nervous/anxious.       Objective:   Vitals:   08/23/21 1302  BP: 134/72  Pulse: 68  Temp: 98.2 F (36.8 C)  SpO2: 96%   Filed Weights   08/23/21 1302  Weight: 207 lb (93.9 kg)   Body mass index is 31.47 kg/m.  BP Readings from Last 3 Encounters:  08/23/21 134/72  07/22/20 138/68  07/22/19 (!) 142/90    Wt Readings from Last 3 Encounters:  08/23/21 207 lb (93.9 kg)  07/22/20 208 lb (94.3 kg)  07/22/19 219 lb 6.4 oz (99.5 kg)    Depression screen [facility] 2/9 04/23/2021 07/22/2020 07/22/2019 07/17/2017 07/14/2016  Decreased Interest 0 0 0 0 0  Down, Depressed, Hopeless 0 0 0 0 0  PHQ - 2 Score 0 0 0 0 0     No flowsheet data found.     Physical Exam Constitutional: She appears well-developed and well-nourished. No distress.  HENT:  Head: Normocephalic and atraumatic.  Right Ear: External ear normal. Normal ear canal and TM Left Ear: External ear normal.  Normal ear canal and TM Mouth/Throat: Oropharynx is clear and moist.  Eyes: Conjunctivae and EOM are normal.  Neck: Neck supple. No tracheal deviation present. No thyromegaly present.  No carotid bruit  Cardiovascular: Normal rate, regular rhythm and normal heart sounds.   No murmur heard.  No edema. Pulmonary/Chest: Effort normal and breath sounds normal. No respiratory distress. She has no wheezes. She has no rales.  Breast: deferred   Abdominal: Soft. She exhibits no distension. There is no tenderness.   Lymphadenopathy: She has no cervical adenopathy.  Gait: uses cane, bent over Skin: Skin is warm and dry. She is not diaphoretic.  Psychiatric: She has a normal mood and affect. Her behavior is normal.     Lab Results  Component Value Date   WBC 6.5 07/22/2020   HGB 14.7 07/22/2020   HCT 44.5 07/22/2020   PLT 193.0 07/22/2020   GLUCOSE 93 07/22/2020   CHOL 164 07/22/2020   TRIG 135.0 07/22/2020   HDL 47.50 07/22/2020   LDLDIRECT 97.0 07/17/2017   LDLCALC 89 07/22/2020   ALT 18 07/22/2020   AST 30 07/22/2020   NA 139 07/22/2020   K 4.0 07/22/2020   CL 107 07/22/2020   CREATININE 0.67 07/22/2020   BUN 13 07/22/2020   CO2 26  07/22/2020   TSH 1.60 07/22/2020   HGBA1C 5.5 07/22/2020         Assessment & Plan:   Physical exam: Screening blood work  ordered Exercise  active - stressed as much activity as possible Discussed fall prevention Weight  has lost some weight  Substance abuse  none   Reviewed recommended immunizations.   Health Maintenance  Topic Date Due   Fecal DNA (Cologuard)  Never done   COVID-19 Vaccine (4 - Booster for Moderna series) 09/08/2021 (Originally 07/25/2020)   MAMMOGRAM  08/23/2022 (Originally 03/13/1997)   DEXA SCAN  08/23/2022 (Originally 07/18/2021)   TETANUS/TDAP  07/14/2026   Pneumonia Vaccine 26+ Years old  Completed   INFLUENZA VACCINE  Completed   Hepatitis C Screening  Completed   HPV VACCINES  Aged Out   Zoster Vaccines- Shingrix  Discontinued    Declined dexa, mammo  Will have cologuard sent to her.       See Problem List for Assessment and Plan of chronic medical problems.

## 2021-08-22 NOTE — Patient Instructions (Addendum)
° °Blood work was ordered.   ° ° °Medications changes include :  none  ° ° °Please followup in 1 year ° ° °Health Maintenance, Female °Adopting a healthy lifestyle and getting preventive care are important in promoting health and wellness. Ask your health care provider about: °The right schedule for you to have regular tests and exams. °Things you can do on your own to prevent diseases and keep yourself healthy. °What should I know about diet, weight, and exercise? °Eat a healthy diet ° °Eat a diet that includes plenty of vegetables, fruits, low-fat dairy products, and lean protein. °Do not eat a lot of foods that are high in solid fats, added sugars, or sodium. °Maintain a healthy weight °Body mass index (BMI) is used to identify weight problems. It estimates body fat based on height and weight. Your health care provider can help determine your BMI and help you achieve or maintain a healthy weight. °Get regular exercise °Get regular exercise. This is one of the most important things you can do for your health. Most adults should: °Exercise for at least 150 minutes each week. The exercise should increase your heart rate and make you sweat (moderate-intensity exercise). °Do strengthening exercises at least twice a week. This is in addition to the moderate-intensity exercise. °Spend less time sitting. Even light physical activity can be beneficial. °Watch cholesterol and blood lipids °Have your blood tested for lipids and cholesterol at 75 years of age, then have this test every 5 years. °Have your cholesterol levels checked more often if: °Your lipid or cholesterol levels are high. °You are older than 75 years of age. °You are at high risk for heart disease. °What should I know about cancer screening? °Depending on your health history and family history, you may need to have cancer screening at various ages. This may include screening for: °Breast cancer. °Cervical cancer. °Colorectal cancer. °Skin cancer. °Lung  cancer. °What should I know about heart disease, diabetes, and high blood pressure? °Blood pressure and heart disease °High blood pressure causes heart disease and increases the risk of stroke. This is more likely to develop in people who have high blood pressure readings or are overweight. °Have your blood pressure checked: °Every 3-5 years if you are 18-39 years of age. °Every year if you are 40 years old or older. °Diabetes °Have regular diabetes screenings. This checks your fasting blood sugar level. Have the screening done: °Once every three years after age 40 if you are at a normal weight and have a low risk for diabetes. °More often and at a younger age if you are overweight or have a high risk for diabetes. °What should I know about preventing infection? °Hepatitis B °If you have a higher risk for hepatitis B, you should be screened for this virus. Talk with your health care provider to find out if you are at risk for hepatitis B infection. °Hepatitis C °Testing is recommended for: °Everyone born from 1945 through 1965. °Anyone with known risk factors for hepatitis C. °Sexually transmitted infections (STIs) °Get screened for STIs, including gonorrhea and chlamydia, if: °You are sexually active and are younger than 75 years of age. °You are older than 75 years of age and your health care provider tells you that you are at risk for this type of infection. °Your sexual activity has changed since you were last screened, and you are at increased risk for chlamydia or gonorrhea. Ask your health care provider if you are at risk. °Ask your   health care provider about whether you are at high risk for HIV. Your health care provider may recommend a prescription medicine to help prevent HIV infection. If you choose to take medicine to prevent HIV, you should first get tested for HIV. You should then be tested every 3 months for as long as you are taking the medicine. °Pregnancy °If you are about to stop having your  period (premenopausal) and you may become pregnant, seek counseling before you get pregnant. °Take 400 to 800 micrograms (mcg) of folic acid every day if you become pregnant. °Ask for birth control (contraception) if you want to prevent pregnancy. °Osteoporosis and menopause °Osteoporosis is a disease in which the bones lose minerals and strength with aging. This can result in bone fractures. If you are 65 years old or older, or if you are at risk for osteoporosis and fractures, ask your health care provider if you should: °Be screened for bone loss. °Take a calcium or vitamin D supplement to lower your risk of fractures. °Be given hormone replacement therapy (HRT) to treat symptoms of menopause. °Follow these instructions at home: °Alcohol use °Do not drink alcohol if: °Your health care provider tells you not to drink. °You are pregnant, may be pregnant, or are planning to become pregnant. °If you drink alcohol: °Limit how much you have to: °0-1 drink a day. °Know how much alcohol is in your drink. In the [place] one drink equals one 12 oz bottle of beer (355 mL), one 5 oz glass of wine (148 mL), or one 1½ oz glass of hard liquor (44 mL). °Lifestyle °Do not use any products that contain nicotine or tobacco. These products include cigarettes, chewing tobacco, and vaping devices, such as e-cigarettes. If you need help quitting, ask your health care provider. °Do not use street drugs. °Do not share needles. °Ask your health care provider for help if you need support or information about quitting drugs. °General instructions °Schedule regular health, dental, and eye exams. °Stay current with your vaccines. °Tell your health care provider if: °You often feel depressed. °You have ever been abused or do not feel safe at home. °Summary °Adopting a healthy lifestyle and getting preventive care are important in promoting health and wellness. °Follow your health care provider's instructions about healthy diet, exercising, and  getting tested or screened for diseases. °Follow your health care provider's instructions on monitoring your cholesterol and blood pressure. °This information is not intended to replace advice given to you by your health care provider. Make sure you discuss any questions you have with your health care provider. °Document Revised: 12/07/2020 Document Reviewed: 12/07/2020 °Elsevier Patient Education © 2022 [employer] ° °

## 2021-08-23 ENCOUNTER — Ambulatory Visit (INDEPENDENT_AMBULATORY_CARE_PROVIDER_SITE_OTHER): Payer: Medicare HMO | Admitting: Internal Medicine

## 2021-08-23 ENCOUNTER — Other Ambulatory Visit: Payer: Self-pay

## 2021-08-23 ENCOUNTER — Encounter: Payer: Medicare HMO | Admitting: Internal Medicine

## 2021-08-23 VITALS — BP 134/72 | HR 68 | Temp 98.2°F | Ht 68.0 in | Wt 207.0 lb

## 2021-08-23 DIAGNOSIS — E781 Pure hyperglyceridemia: Secondary | ICD-10-CM | POA: Diagnosis not present

## 2021-08-23 DIAGNOSIS — M5416 Radiculopathy, lumbar region: Secondary | ICD-10-CM | POA: Diagnosis not present

## 2021-08-23 DIAGNOSIS — E559 Vitamin D deficiency, unspecified: Secondary | ICD-10-CM

## 2021-08-23 DIAGNOSIS — Z0000 Encounter for general adult medical examination without abnormal findings: Secondary | ICD-10-CM

## 2021-08-23 DIAGNOSIS — R7303 Prediabetes: Secondary | ICD-10-CM | POA: Diagnosis not present

## 2021-08-23 DIAGNOSIS — M85859 Other specified disorders of bone density and structure, unspecified thigh: Secondary | ICD-10-CM

## 2021-08-23 NOTE — Assessment & Plan Note (Signed)
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

## 2021-08-23 NOTE — Assessment & Plan Note (Signed)
Chronic Check lipid panel  Continue diet control Regular exercise and healthy diet encouraged  

## 2021-08-23 NOTE — Assessment & Plan Note (Signed)
Chronic Taking vitamin D daily Check vitamin D level  

## 2021-08-23 NOTE — Assessment & Plan Note (Signed)
Chronic Check a1c Low sugar / carb diet Stressed regular exercise  

## 2021-08-27 ENCOUNTER — Other Ambulatory Visit: Payer: Self-pay

## 2021-08-27 ENCOUNTER — Telehealth: Payer: Self-pay

## 2021-08-27 DIAGNOSIS — Z1211 Encounter for screening for malignant neoplasm of colon: Secondary | ICD-10-CM

## 2021-08-27 NOTE — Telephone Encounter (Signed)
-----   Message from Pincus Sanes, MD sent at 08/23/2021  1:20 PM EST ----- Please order her a cologuard

## 2021-08-27 NOTE — Telephone Encounter (Signed)
Ordered today 

## 2022-04-25 ENCOUNTER — Ambulatory Visit (INDEPENDENT_AMBULATORY_CARE_PROVIDER_SITE_OTHER): Payer: Medicare HMO | Admitting: *Deleted

## 2022-04-25 VITALS — Ht 68.0 in | Wt 207.0 lb

## 2022-04-25 DIAGNOSIS — Z0000 Encounter for general adult medical examination without abnormal findings: Secondary | ICD-10-CM

## 2022-04-25 NOTE — Addendum Note (Signed)
Addended by: Rae Mar on: 04/25/2022 11:26 AM   Modules accepted: Level of Service

## 2022-04-25 NOTE — Progress Notes (Deleted)
New prescription for [facility] sent to requested pharmacy

## 2022-04-25 NOTE — Progress Notes (Signed)
Subjective:   [name] is a 76 y.o. female who presents for Medicare Annual (Subsequent) preventive examination. I connected with  [name] on 04/25/22 by a audio enabled telemedicine application and verified that I am speaking with the correct person using two identifiers.  Patient Location: Home  Provider Location: Office/Clinic  I discussed the limitations of evaluation and management by telemedicine. The patient expressed understanding and agreed to proceed.  Review of Systems    Defer to PCP   See problem list for additional risk factors       Objective:    Today's Vitals   04/25/22 0934  Weight: 207 lb (93.9 kg)  Height: 5\' 8"  (1.727 m)   Body mass index is 31.47 kg/m.     04/25/2022    9:33 AM 04/23/2021    9:41 AM 07/09/2015    2:00 AM 07/06/2015    1:04 PM  Advanced Directives  Does Patient Have a Medical Advance Directive? Yes Yes No No  Type of Advance Directive Living will Living will    Does patient want to make changes to medical advance directive?  No - Patient declined    Would patient like information on creating a medical advance directive?   Yes - Educational materials given Yes - 14/11/2014 given    Current Medications (verified) Outpatient Encounter Medications as of 04/25/2022  Medication Sig   Bioflavonoid Products (BIOFLEX PO) Take 1 capsule by mouth daily.    cholecalciferol (VITAMIN D) 1000 units tablet Take 1 tablet (1,000 Units total) by mouth daily.   loratadine (CLARITIN) 10 MG tablet Take 10 mg by mouth daily.   No facility-administered encounter medications on file as of 04/25/2022.    Allergies (verified) Patient has no known allergies.   History: Past Medical History:  Diagnosis Date   Arthritis    Hands and elbows   GERD (gastroesophageal reflux disease)    tums prn   Medical history non-contributory    Seasonal allergies    Past Surgical History:  Procedure Laterality Date    KYPHOPLASTY N/A 07/08/2015   Procedure: L1 KYPHOPLASTY;  Surgeon: 14/01/2015, MD;  Location: [facility] OR;  Service: Orthopedics;  Laterality: N/A;   ORIF ANKLE FRACTURE Right 2003   TRANSFORAMINAL LUMBAR INTERBODY FUSION (TLIF) WITH PEDICLE SCREW FIXATION 1 LEVEL N/A 07/08/2015   Procedure: L5 - S1 TRANSFORAMINAL LUMBAR INTERBODY FUSION (TLIF) WITH PEDICLE SCREW FIXATION 1 LEVEL;  Surgeon: 14/01/2015, MD;  Location: [facility] OR;  Service: Orthopedics;  Laterality: N/A;   Family History  Problem Relation Age of Onset   Diabetes Mother    Hypertension Mother    Muscular dystrophy Father    Hypotension Father    Diabetes Maternal Grandmother    Social History   Socioeconomic History   Marital status: Married    Spouse name: Not on file   Number of children: Not on file   Years of education: Not on file   Highest education level: Not on file  Occupational History   Not on file  Tobacco Use   Smoking status: Never   Smokeless tobacco: Never  Substance and Sexual Activity   Alcohol use: No   Drug use: No   Sexual activity: Not on file  Other Topics Concern   Not on file  Social History Narrative   Not on file   Social Determinants of Health   Financial Resource Strain: Low Risk  (04/25/2022)   Overall Financial Resource Strain (CARDIA)  Difficulty of Paying Living Expenses: Not hard at all  Food Insecurity: No Food Insecurity (04/25/2022)   Hunger Vital Sign    Worried About Running Out of Food in the Last Year: Never true    Ran Out of Food in the Last Year: Never true  Transportation Needs: No Transportation Needs (04/25/2022)   PRAPARE - Administrator, Civil Service (Medical): No    Lack of Transportation (Non-Medical): No  Physical Activity: Insufficiently Active (04/25/2022)   Exercise Vital Sign    Days of Exercise per Week: 2 days    Minutes of Exercise per Session: 20 min  Stress: No Stress Concern Present (04/25/2022)   [employer] of Occupational  Health - Occupational Stress Questionnaire    Feeling of Stress : Not at all  Social Connections: Moderately Integrated (04/25/2022)   Social Connection and Isolation Panel [NHANES]    Frequency of Communication with Friends and Family: More than three times a week    Frequency of Social Gatherings with Friends and Family: Three times a week    Attends Religious Services: More than 4 times per year    Active Member of Clubs or Organizations: No    Attends Banker Meetings: Never    Marital Status: Married    Tobacco Counseling Counseling given: Not Answered   Clinical Intake:  Pre-visit preparation completed: Yes  Pain : No/denies pain     Nutritional Status: BMI > 30  Obese Nutritional Risks: None Diabetes: No  What is the last grade level you completed in school?: 12th grade  Diabetic?n/a  Interpreter Needed?: No      Activities of Daily Living    04/25/2022    9:39 AM  In your present state of health, do you have any difficulty performing the following activities:  Hearing? 0  Vision? 0  Comment patient has reading glasses, and see long distance  Difficulty concentrating or making decisions? 0  Walking or climbing stairs? 1  Dressing or bathing? 0  Doing errands, shopping? 0    Patient Care Team: Pincus Sanes, MD as PCP - General (Internal Medicine)  Indicate any recent Medical Services you may have received from other than [facility] providers in the past year (date may be approximate).     Assessment:   This is a routine wellness examination for [name].  Hearing/Vision screen No results found.  Dietary issues and exercise activities discussed: Current Exercise Habits: Home exercise routine, Type of exercise: walking, Time (Minutes): 20, Frequency (Times/Week): 3, Weekly Exercise (Minutes/Week): 60, Intensity: Mild, Exercise limited by: None identified   Goals Addressed   None    Depression Screen    04/25/2022    9:33 AM 04/23/2021     9:29 AM 07/22/2020    8:58 AM 07/22/2019    8:50 AM 07/17/2017    1:56 PM 07/14/2016    9:48 AM  PHQ 2/9 Scores  PHQ - 2 Score 0 0 0 0 0 0    Fall Risk    04/25/2022    9:33 AM 04/23/2021    9:25 AM 07/22/2020    8:58 AM 07/22/2019    8:49 AM 07/17/2017    1:56 PM  Fall Risk   Falls in the past year? 0 0 1 0 No  Number falls in past yr: 0 0 0 0   Injury with Fall? 0 0 0    Risk for fall due to : No Fall Risks No Fall Risks No Fall Risks  Follow up Falls evaluation completed Falls evaluation completed Falls evaluation completed      FALL RISK PREVENTION PERTAINING TO THE HOME:  Any stairs in or around the home? No  If so, are there any without handrails? No  Home free of loose throw rugs in walkways, pet beds, electrical cords, etc? Yes  Adequate lighting in your home to reduce risk of falls? Yes   ASSISTIVE DEVICES UTILIZED TO PREVENT FALLS:  Life alert? No  Use of a cane, walker or w/c? No  Grab bars in the bathroom? No  Shower chair or bench in shower? No  Elevated toilet seat or a handicapped toilet? No   TIMED UP AND GO:  Was the test performed? No .  Length of time to ambulate 10 feet: n/a sec.     Cognitive Function:        04/25/2022    9:40 AM  6CIT Screen  What Year? 0 points  What month? 0 points  What time? 0 points  Count back from 20 0 points  Months in reverse 0 points  Repeat phrase 2 points  Total Score 2 points    Immunizations Immunization History  Administered Date(s) Administered   Fluad Quad(high Dose 65+) 04/02/2019, 04/06/2021   Influenza, High Dose Seasonal PF 04/08/2020   Influenza-Unspecified 05/11/2015, 04/01/2016, 04/19/2016, 04/10/2017   Moderna Sars-Covid-2 Vaccination 08/31/2019, 09/30/2019, 05/30/2020   Pneumococcal Conjugate-13 06/02/2015   Pneumococcal Polysaccharide-23 07/14/2016   Tdap 07/14/2016   Zoster Recombinat (Shingrix) 07/16/2021    TDAP status: Up to date  Flu Vaccine status: Due, Education has  been provided regarding the importance of this vaccine. Advised may receive this vaccine at local pharmacy or Health Dept. Aware to provide a copy of the vaccination record if obtained from local pharmacy or Health Dept. Verbalized acceptance and understanding.  Pneumococcal vaccine status: Up to date  Covid-19 vaccine status: Completed vaccines  Qualifies for Shingles Vaccine? Yes   Zostavax completed No   Shingrix Completed?: No.    Education has been provided regarding the importance of this vaccine. Patient has been advised to call insurance company to determine out of pocket expense if they have not yet received this vaccine. Advised may also receive vaccine at local pharmacy or Health Dept. Verbalized acceptance and understanding.  Screening Tests Health Maintenance  Topic Date Due   Fecal DNA (Cologuard)  Never done   COVID-19 Vaccine (4 - Moderna series) 07/25/2020   INFLUENZA VACCINE  03/01/2022   DEXA SCAN  08/23/2022 (Originally 07/18/2021)   TETANUS/TDAP  07/14/2026   Pneumonia Vaccine 19+ Years old  Completed   Hepatitis C Screening  Completed   HPV VACCINES  Aged Out   Zoster Vaccines- Shingrix  Discontinued    Health Maintenance  Health Maintenance Due  Topic Date Due   Fecal DNA (Cologuard)  Never done   COVID-19 Vaccine (4 - Moderna series) 07/25/2020   INFLUENZA VACCINE  03/01/2022    Colorectal screening- patient will complete the cologuard and get it sent in to the company   Mammogram status: No longer required due to aged out .  Bond density- patient postponed this test  Lung Cancer Screening: (Low Dose CT Chest recommended if Age 83-80 years, 30 pack-year currently smoking OR have quit w/in 15years.) does not qualify.   Lung Cancer Screening Referral: N/A  Additional Screening:  Hepatitis C Screening: does qualify; Completed 07/14/2016  Vision Screening: Recommended annual ophthalmology exams for early detection of glaucoma and other disorders of  the eye. Is the patient up to date with their annual eye exam?  Yes  Who is the provider or what is the name of the office in which the patient attends annual eye exams? [facility] If pt is not established with a provider, would they like to be referred to a provider to establish care? No .   Dental Screening: Recommended annual dental exams for proper oral hygiene  Community Resource Referral / Chronic Care Management: CRR required this visit?  No   CCM required this visit?  No      Plan:     I have personally reviewed and noted the following in the patient's chart:   Medical and social history Use of alcohol, tobacco or illicit drugs  Current medications and supplements including opioid prescriptions. Patient is not currently taking opioid prescriptions. Functional ability and status Nutritional status Physical activity Advanced directives List of other physicians Hospitalizations, surgeries, and ER visits in previous 12 months Vitals Screenings to include cognitive, depression, and falls Referrals and appointments  In addition, I have reviewed and discussed with patient certain preventive protocols, quality metrics, and best practice recommendations. A written personalized care plan for preventive services as well as general preventive health recommendations were provided to patient.     Tamela Oddi, CMA   04/25/2022  Non face to face 45 minutes  Nurse Notes:  [name] , Thank you for taking time to come for your Medicare Wellness Visit. I appreciate your ongoing commitment to your health goals. Please review the following plan we discussed and let me know if I can assist you in the future.   These are the goals we discussed:  Goals   None     This is a list of the screening recommended for you and due dates:  Health Maintenance  Topic Date Due   Cologuard (Stool DNA test)  Never done   COVID-19 Vaccine (4 - Moderna series) 07/25/2020   Flu Shot   03/01/2022   DEXA scan (bone density measurement)  08/23/2022*   Tetanus Vaccine  07/14/2026   Pneumonia Vaccine  Completed   Hepatitis C Screening: USPSTF Recommendation to screen - Ages 22-79 yo.  Completed   HPV Vaccine  Aged Out   Zoster (Shingles) Vaccine  Discontinued  *Topic was postponed. The date shown is not the original due date.

## 2022-08-25 ENCOUNTER — Encounter: Payer: Medicare HMO | Admitting: Internal Medicine

## 2022-11-17 ENCOUNTER — Telehealth: Payer: Self-pay | Admitting: Internal Medicine

## 2022-11-17 DIAGNOSIS — R2689 Other abnormalities of gait and mobility: Secondary | ICD-10-CM

## 2022-11-17 DIAGNOSIS — M5416 Radiculopathy, lumbar region: Secondary | ICD-10-CM

## 2022-11-17 NOTE — Telephone Encounter (Signed)
Patient's daughter called and said that a prior authorization needs to be done for a standing walker - please send this to her insurance company

## 2022-11-20 NOTE — Telephone Encounter (Signed)
Printed dme - may need documentation from upcoming visit for coverage

## 2022-11-21 NOTE — Telephone Encounter (Signed)
Message sent to [employer] to take care of request for DME.

## 2022-11-23 NOTE — Patient Instructions (Addendum)
Blood work was ordered.   The lab is on the first floor.    Medications changes include :   None  taking 500-600 mg of calcium twice a day with food.      Return in about 1 year (around 11/24/2023) for Physical Exam, Schedule DEXA-[place].     Health Maintenance, Female Adopting a healthy lifestyle and getting preventive care are important in promoting health and wellness. Ask your health care provider about: The right schedule for you to have regular tests and exams. Things you can do on your own to prevent diseases and keep yourself healthy. What should I know about diet, weight, and exercise? Eat a healthy diet  Eat a diet that includes plenty of vegetables, fruits, low-fat dairy products, and lean protein. Do not eat a lot of foods that are high in solid fats, added sugars, or sodium. Maintain a healthy weight Body mass index (BMI) is used to identify weight problems. It estimates body fat based on height and weight. Your health care provider can help determine your BMI and help you achieve or maintain a healthy weight. Get regular exercise Get regular exercise. This is one of the most important things you can do for your health. Most adults should: Exercise for at least 150 minutes each week. The exercise should increase your heart rate and make you sweat (moderate-intensity exercise). Do strengthening exercises at least twice a week. This is in addition to the moderate-intensity exercise. Spend less time sitting. Even light physical activity can be beneficial. Watch cholesterol and blood lipids Have your blood tested for lipids and cholesterol at 76 years of age, then have this test every 5 years. Have your cholesterol levels checked more often if: Your lipid or cholesterol levels are high. You are older than 76 years of age. You are at high risk for heart disease. What should I know about cancer screening? Depending on your health history and family history, you may  need to have cancer screening at various ages. This may include screening for: Breast cancer. Cervical cancer. Colorectal cancer. Skin cancer. Lung cancer. What should I know about heart disease, diabetes, and high blood pressure? Blood pressure and heart disease High blood pressure causes heart disease and increases the risk of stroke. This is more likely to develop in people who have high blood pressure readings or are overweight. Have your blood pressure checked: Every 3-5 years if you are 22-77 years of age. Every year if you are 3 years old or older. Diabetes Have regular diabetes screenings. This checks your fasting blood sugar level. Have the screening done: Once every three years after age 77 if you are at a normal weight and have a low risk for diabetes. More often and at a younger age if you are overweight or have a high risk for diabetes. What should I know about preventing infection? Hepatitis B If you have a higher risk for hepatitis B, you should be screened for this virus. Talk with your health care provider to find out if you are at risk for hepatitis B infection. Hepatitis C Testing is recommended for: Everyone born from [birth year] through 1964. Anyone with known risk factors for hepatitis C. Sexually transmitted infections (STIs) Get screened for STIs, including gonorrhea and chlamydia, if: You are sexually active and are younger than 76 years of age. You are older than 76 years of age and your health care provider tells you that you are at risk for this type  of infection. Your sexual activity has changed since you were last screened, and you are at increased risk for chlamydia or gonorrhea. Ask your health care provider if you are at risk. Ask your health care provider about whether you are at high risk for HIV. Your health care provider may recommend a prescription medicine to help prevent HIV infection. If you choose to take medicine to prevent HIV, you should first get  tested for HIV. You should then be tested every 3 months for as long as you are taking the medicine. Pregnancy If you are about to stop having your period (premenopausal) and you may become pregnant, seek counseling before you get pregnant. Take 400 to 800 micrograms (mcg) of folic acid every day if you become pregnant. Ask for birth control (contraception) if you want to prevent pregnancy. Osteoporosis and menopause Osteoporosis is a disease in which the bones lose minerals and strength with aging. This can result in bone fractures. If you are 53 years old or older, or if you are at risk for osteoporosis and fractures, ask your health care provider if you should: Be screened for bone loss. Take a calcium or vitamin D supplement to lower your risk of fractures. Be given hormone replacement therapy (HRT) to treat symptoms of menopause. Follow these instructions at home: Alcohol use Do not drink alcohol if: Your health care provider tells you not to drink. You are pregnant, may be pregnant, or are planning to become pregnant. If you drink alcohol: Limit how much you have to: 0-1 drink a day. Know how much alcohol is in your drink. In the [place] one drink equals one 12 oz bottle of beer (355 mL), one 5 oz glass of wine (148 mL), or one 1 oz glass of hard liquor (44 mL). Lifestyle Do not use any products that contain nicotine or tobacco. These products include cigarettes, chewing tobacco, and vaping devices, such as e-cigarettes. If you need help quitting, ask your health care provider. Do not use street drugs. Do not share needles. Ask your health care provider for help if you need support or information about quitting drugs. General instructions Schedule regular health, dental, and eye exams. Stay current with your vaccines. Tell your health care provider if: You often feel depressed. You have ever been abused or do not feel safe at home. Summary Adopting a healthy lifestyle and getting  preventive care are important in promoting health and wellness. Follow your health care provider's instructions about healthy diet, exercising, and getting tested or screened for diseases. Follow your health care provider's instructions on monitoring your cholesterol and blood pressure. This information is not intended to replace advice given to you by your health care provider. Make sure you discuss any questions you have with your health care provider. Document Revised: 12/07/2020 Document Reviewed: 12/07/2020 Elsevier Patient Education  2023 [employer].

## 2022-11-23 NOTE — Progress Notes (Unsigned)
Subjective:    Patient ID: [name], female    DOB: 1945-08-27, 77 y.o.   MRN: [id number]      HPI [name] is here for a Physical exam and her chronic medical problems.    She fell in December - she fell straight back.  Has a little worse back pain that previous - back pain only when she tries to straighten up.  Not able to stand up straight  - bent over.  Using a walker to ambulate   She is a poor balance and lumbar radiculopathy with chronic left leg numbness, now with worsening kyphosis and is needing a walker-would prefer a platform walker.  Platform walker needed for- kyphosis, back pain, lumbar radiculopathy, poor blaance   Medications and allergies reviewed with patient and updated if appropriate.  Current Outpatient Medications on File Prior to Visit  Medication Sig Dispense Refill   Bioflavonoid Products (BIOFLEX PO) Take 1 capsule by mouth daily.      cholecalciferol (VITAMIN D) 1000 units tablet Take 1 tablet (1,000 Units total) by mouth daily.     loratadine (CLARITIN) 10 MG tablet Take 10 mg by mouth daily.     No current facility-administered medications on file prior to visit.    Review of Systems  Constitutional:  Negative for fever.  Eyes:  Negative for visual disturbance.  Respiratory:  Negative for cough, shortness of breath and wheezing.   Cardiovascular:  Negative for chest pain, palpitations and leg swelling.  Gastrointestinal:  Negative for abdominal pain, blood in stool, constipation and diarrhea.       No gerd  Genitourinary:  Negative for dysuria.  Musculoskeletal:  Positive for arthralgias (mild) and back pain.  Skin:  Negative for rash.  Neurological:  Positive for numbness (chronic left leg) and headaches (occ sinus). Negative for light-headedness.  Psychiatric/Behavioral:  Negative for dysphoric mood. The patient is not nervous/anxious.        Objective:   Vitals:   11/24/22 0939  BP: 134/60  Pulse: 64  Temp: 98.2 F  (36.8 C)  SpO2: 96%   Filed Weights   11/24/22 0939  Weight: 196 lb (88.9 kg)   Body mass index is 29.8 kg/m.  BP Readings from Last 3 Encounters:  11/24/22 134/60  08/23/21 134/72  07/22/20 138/68    Wt Readings from Last 3 Encounters:  11/24/22 196 lb (88.9 kg)  04/25/22 207 lb (93.9 kg)  08/23/21 207 lb (93.9 kg)       Physical Exam Constitutional: She appears well-developed and well-nourished. No distress.  HENT:  Head: Normocephalic and atraumatic.  Right Ear: External ear normal. Normal ear canal and TM Left Ear: External ear normal.  Normal ear canal and TM Mouth/Throat: Oropharynx is clear and moist.  Eyes: Conjunctivae normal.  Neck: Neck supple. No tracheal deviation present. No thyromegaly present.  No carotid bruit  Cardiovascular: Normal rate, regular rhythm and normal heart sounds.   No murmur heard.  No edema. Pulmonary/Chest: Effort normal and breath sounds normal. No respiratory distress. She has no wheezes. She has no rales.  Breast: deferred   Abdominal: Soft. She exhibits no distension. There is no tenderness.  Lymphadenopathy: She has no cervical adenopathy.  Musculoskeletal severe kyphosis - no tenderness along spine Skin: Skin is warm and dry. She is not diaphoretic.  Psychiatric: She has a normal mood and affect. Her behavior is normal.     Lab Results  Component Value Date   WBC 5.7 11/24/2022  HGB 14.7 11/24/2022   HCT 43.9 11/24/2022   PLT 173.0 11/24/2022   GLUCOSE 87 11/24/2022   CHOL 152 11/24/2022   TRIG 124.0 11/24/2022   HDL 47.10 11/24/2022   LDLDIRECT 97.0 07/17/2017   LDLCALC 80 11/24/2022   ALT 18 11/24/2022   AST 34 11/24/2022   NA 140 11/24/2022   K 4.3 11/24/2022   CL 105 11/24/2022   CREATININE 0.58 11/24/2022   BUN 12 11/24/2022   CO2 27 11/24/2022   TSH 1.23 11/24/2022   HGBA1C 5.4 11/24/2022         Assessment & Plan:   Physical exam: Screening blood work  ordered Exercise  none Weight  ok for  age Substance abuse  none   Reviewed recommended immunizations.   Health Maintenance  Topic Date Due   DEXA SCAN  07/18/2021   COVID-19 Vaccine (4 - 2023-24 season) 12/10/2022 (Originally 04/01/2022)   Fecal DNA (Cologuard)  11/24/2023 (Originally 03/13/1992)   INFLUENZA VACCINE  03/02/2023   Medicare Annual Wellness (AWV)  04/26/2023   DTaP/Tdap/Td (2 - Td or Tdap) 07/14/2026   Pneumonia Vaccine 74+ Years old  Completed   Hepatitis C Screening  Completed   HPV VACCINES  Aged Out   Zoster Vaccines- Shingrix  Discontinued          See Problem List for Assessment and Plan of chronic medical problems.

## 2022-11-24 ENCOUNTER — Encounter: Payer: Self-pay | Admitting: Internal Medicine

## 2022-11-24 ENCOUNTER — Ambulatory Visit (INDEPENDENT_AMBULATORY_CARE_PROVIDER_SITE_OTHER): Payer: Medicare HMO | Admitting: Internal Medicine

## 2022-11-24 VITALS — BP 134/60 | HR 64 | Temp 98.2°F | Ht 68.0 in | Wt 196.0 lb

## 2022-11-24 DIAGNOSIS — M40204 Unspecified kyphosis, thoracic region: Secondary | ICD-10-CM

## 2022-11-24 DIAGNOSIS — Z0000 Encounter for general adult medical examination without abnormal findings: Secondary | ICD-10-CM

## 2022-11-24 DIAGNOSIS — E2839 Other primary ovarian failure: Secondary | ICD-10-CM

## 2022-11-24 DIAGNOSIS — E559 Vitamin D deficiency, unspecified: Secondary | ICD-10-CM | POA: Diagnosis not present

## 2022-11-24 DIAGNOSIS — R7303 Prediabetes: Secondary | ICD-10-CM | POA: Diagnosis not present

## 2022-11-24 DIAGNOSIS — M40209 Unspecified kyphosis, site unspecified: Secondary | ICD-10-CM | POA: Insufficient documentation

## 2022-11-24 DIAGNOSIS — E781 Pure hyperglyceridemia: Secondary | ICD-10-CM | POA: Diagnosis not present

## 2022-11-24 DIAGNOSIS — M85859 Other specified disorders of bone density and structure, unspecified thigh: Secondary | ICD-10-CM | POA: Diagnosis not present

## 2022-11-24 DIAGNOSIS — R2681 Unsteadiness on feet: Secondary | ICD-10-CM

## 2022-11-24 DIAGNOSIS — M5416 Radiculopathy, lumbar region: Secondary | ICD-10-CM

## 2022-11-24 LAB — LIPID PANEL
Cholesterol: 152 mg/dL (ref 0–200)
HDL: 47.1 mg/dL (ref >39.00)
LDL Cholesterol: 80 mg/dL (ref 0–99)
NonHDL: 104.62
Total CHOL/HDL Ratio: 3
Triglycerides: 124 mg/dL (ref 0.0–149.0)
VLDL: 24.8 mg/dL (ref 0.0–40.0)

## 2022-11-24 LAB — COMPREHENSIVE METABOLIC PANEL
ALT: 18 U/L (ref 0–35)
AST: 34 U/L (ref 0–37)
Albumin: 3.8 g/dL (ref 3.5–5.2)
Alkaline Phosphatase: 93 U/L (ref 39–117)
BUN: 12 mg/dL (ref 6–23)
CO2: 27 mEq/L (ref 19–32)
Calcium: 9.4 mg/dL (ref 8.4–10.5)
Chloride: 105 mEq/L (ref 96–112)
Creatinine, Ser: 0.58 mg/dL (ref 0.40–1.20)
GFR: 88.35 mL/min (ref >60.00)
Glucose, Bld: 87 mg/dL (ref 70–99)
Potassium: 4.3 mEq/L (ref 3.5–5.1)
Sodium: 140 mEq/L (ref 135–145)
Total Bilirubin: 0.7 mg/dL (ref 0.2–1.2)
Total Protein: 7.6 g/dL (ref 6.0–8.3)

## 2022-11-24 LAB — CBC WITH DIFFERENTIAL/PLATELET
Basophils Absolute: 0 10*3/uL (ref 0.0–0.1)
Basophils Relative: 0.4 % (ref 0.0–3.0)
Eosinophils Absolute: 0.1 10*3/uL (ref 0.0–0.7)
Eosinophils Relative: 0.9 % (ref 0.0–5.0)
HCT: 43.9 % (ref 36.0–46.0)
Hemoglobin: 14.7 g/dL (ref 12.0–15.0)
Lymphocytes Relative: 36.7 % (ref 12.0–46.0)
Lymphs Abs: 2.1 10*3/uL (ref 0.7–4.0)
MCHC: 33.5 g/dL (ref 30.0–36.0)
MCV: 90.5 fl (ref 78.0–100.0)
Monocytes Absolute: 0.3 10*3/uL (ref 0.1–1.0)
Monocytes Relative: 6.1 % (ref 3.0–12.0)
Neutro Abs: 3.2 10*3/uL (ref 1.4–7.7)
Neutrophils Relative %: 55.9 % (ref 43.0–77.0)
Platelets: 173 10*3/uL (ref 150.0–400.0)
RBC: 4.85 Mil/uL (ref 3.87–5.11)
RDW: 15.2 % (ref 11.5–15.5)
WBC: 5.7 10*3/uL (ref 4.0–10.5)

## 2022-11-24 LAB — TSH: TSH: 1.23 u[IU]/mL (ref 0.35–5.50)

## 2022-11-24 LAB — HEMOGLOBIN A1C: Hgb A1c MFr Bld: 5.4 % (ref 4.6–6.5)

## 2022-11-24 LAB — VITAMIN D 25 HYDROXY (VIT D DEFICIENCY, FRACTURES): VITD: 53.72 ng/mL (ref 30.00–100.00)

## 2022-11-24 NOTE — Assessment & Plan Note (Signed)
Chronic Has gotten worse Using walker Assess BMD with dexa - may need to start therapy Stressed PT - she can not do it right now but will let me now if she wants to in the near future Platform walker ordered

## 2022-11-24 NOTE — Assessment & Plan Note (Addendum)
Chronic DEXA due-ordered Discussed fall prevention - using walking Encouraged as much exercise as possible Taking vitamin D-will check level Advised calcium intake through supplementation/food - 500-600 mg bid

## 2022-11-24 NOTE — Assessment & Plan Note (Signed)
Chronic  Left leg numbness - chronic Poor balance Using walker Needs platform walker

## 2022-11-24 NOTE — Assessment & Plan Note (Signed)
She is a poor balance and lumbar radiculopathy with chronic left leg numbness, now with worsening kyphosis and is needing a walker-would prefer a platform walker - ordered Advised PT

## 2022-11-24 NOTE — Assessment & Plan Note (Signed)
Chronic Check lipid panel, CMP, TSH, CBC Continue diet control Regular exercise and healthy diet encouraged

## 2022-11-24 NOTE — Assessment & Plan Note (Signed)
Chronic Taking vitamin D daily Check vitamin D level  

## 2022-11-24 NOTE — Assessment & Plan Note (Signed)
Chronic Check a1c Low sugar / carb diet Stressed regular exercise  

## 2022-11-30 ENCOUNTER — Other Ambulatory Visit: Payer: Self-pay | Admitting: Internal Medicine

## 2022-11-30 DIAGNOSIS — M40204 Unspecified kyphosis, thoracic region: Secondary | ICD-10-CM

## 2022-11-30 DIAGNOSIS — R2681 Unsteadiness on feet: Secondary | ICD-10-CM

## 2022-11-30 DIAGNOSIS — G8929 Other chronic pain: Secondary | ICD-10-CM

## 2023-02-07 ENCOUNTER — Telehealth: Payer: Self-pay | Admitting: Internal Medicine

## 2023-02-07 NOTE — Telephone Encounter (Signed)
There is an order from May for an upright walker.  Patient never received this.  Patient want to know if it can be sent through again.  Please call patient and let her know where it will be coming from.  Patient's number:  [phone]

## 2023-02-09 NOTE — Telephone Encounter (Signed)
Message sent to [facility] 

## 2023-02-14 NOTE — Telephone Encounter (Signed)
[facility] no longer provide upright walkers.  Called a few other DME places today and they are only private pay and do not accept insurance.  Spoke with patient today.  Gave her names for [place] and [facility] to see how much upright walkers would cost.

## 2023-04-27 ENCOUNTER — Telehealth: Payer: Self-pay | Admitting: Internal Medicine

## 2023-04-27 NOTE — Telephone Encounter (Signed)
[name] called and wanted to know what getting FL2 paperwork entails.  She would like a call back to find out what is needed to get the paperwork filled out. Best callback for her is [phone].

## 2023-04-28 ENCOUNTER — Ambulatory Visit (INDEPENDENT_AMBULATORY_CARE_PROVIDER_SITE_OTHER): Payer: Medicare HMO

## 2023-04-28 VITALS — Ht 68.0 in | Wt 187.0 lb

## 2023-04-28 DIAGNOSIS — Z0000 Encounter for general adult medical examination without abnormal findings: Secondary | ICD-10-CM

## 2023-04-28 NOTE — Progress Notes (Signed)
Subjective:   [name] is a 77 y.o. female who presents for Medicare Annual (Subsequent) preventive examination.  Visit Complete: Virtual  I connected with  [name] on 04/28/23 by a audio enabled telemedicine application and verified that I am speaking with the correct person using two identifiers.  Patient Location: Home  Provider Location: Home Office  I discussed the limitations of evaluation and management by telemedicine. The patient expressed understanding and agreed to proceed.  Because this visit was a virtual/telehealth visit, some criteria may be missing or patient reported. Any vitals not documented were not able to be obtained and vitals that have been documented are patient reported.    Cardiac Risk Factors include: advanced age (>93men, >75 women)     Objective:    Today's Vitals   04/28/23 0818  Weight: 187 lb (84.8 kg)  Height: 5\' 8"  (1.727 m)   Body mass index is 28.43 kg/m.     04/28/2023    3:50 PM 04/25/2022    9:33 AM 04/23/2021    9:41 AM 07/09/2015    2:00 AM 07/06/2015    1:04 PM  Advanced Directives  Does Patient Have a Medical Advance Directive? Yes Yes Yes No No  Type of Advance Directive Living will Living will Living will    Does patient want to make changes to medical advance directive?   No - Patient declined    Copy of Healthcare Power of Attorney in Chart? No - copy requested      Would patient like information on creating a medical advance directive?    Yes - Educational materials given Yes - Transport planner given    Current Medications (verified) Outpatient Encounter Medications as of 04/28/2023  Medication Sig   Bioflavonoid Products (BIOFLEX PO) Take 1 capsule by mouth daily.    cholecalciferol (VITAMIN D) 1000 units tablet Take 1 tablet (1,000 Units total) by mouth daily.   loratadine (CLARITIN) 10 MG tablet Take 10 mg by mouth daily.   No facility-administered encounter medications on file as of  04/28/2023.    Allergies (verified) Patient has no known allergies.   History: Past Medical History:  Diagnosis Date   Arthritis    Hands and elbows   GERD (gastroesophageal reflux disease)    tums prn   Medical history non-contributory    Seasonal allergies    Past Surgical History:  Procedure Laterality Date   KYPHOPLASTY N/A 07/08/2015   Procedure: L1 KYPHOPLASTY;  Surgeon: Venita Lick, MD;  Location: [facility] OR;  Service: Orthopedics;  Laterality: N/A;   ORIF ANKLE FRACTURE Right 2003   TRANSFORAMINAL LUMBAR INTERBODY FUSION (TLIF) WITH PEDICLE SCREW FIXATION 1 LEVEL N/A 07/08/2015   Procedure: L5 - S1 TRANSFORAMINAL LUMBAR INTERBODY FUSION (TLIF) WITH PEDICLE SCREW FIXATION 1 LEVEL;  Surgeon: Venita Lick, MD;  Location: [facility] OR;  Service: Orthopedics;  Laterality: N/A;   Family History  Problem Relation Age of Onset   Diabetes Mother    Hypertension Mother    Muscular dystrophy Father    Hypotension Father    Diabetes Maternal Grandmother    Social History   Socioeconomic History   Marital status: Married    Spouse name: Tex   Number of children: 2   Years of education: Not on file   Highest education level: Not on file  Occupational History   Occupation: Retired  Tobacco Use   Smoking status: Never   Smokeless tobacco: Never  Vaping Use   Vaping status: Not on  file  Substance and Sexual Activity   Alcohol use: No   Drug use: No   Sexual activity: Not on file  Other Topics Concern   Not on file  Social History Narrative   Lives with husband.  Has an out door cat.   Social Determinants of Health   Financial Resource Strain: Medium Risk (04/28/2023)   Overall Financial Resource Strain (CARDIA)    Difficulty of Paying Living Expenses: Somewhat hard  Food Insecurity: No Food Insecurity (04/28/2023)   Hunger Vital Sign    Worried About Running Out of Food in the Last Year: Never true    Ran Out of Food in the Last Year: Never true  Transportation Needs: No  Transportation Needs (04/28/2023)   PRAPARE - Administrator, Civil Service (Medical): No    Lack of Transportation (Non-Medical): No  Physical Activity: Inactive (04/28/2023)   Exercise Vital Sign    Days of Exercise per Week: 0 days    Minutes of Exercise per Session: 0 min  Stress: No Stress Concern Present (04/28/2023)   [employer] of Occupational Health - Occupational Stress Questionnaire    Feeling of Stress : Not at all  Social Connections: Moderately Integrated (04/28/2023)   Social Connection and Isolation Panel [NHANES]    Frequency of Communication with Friends and Family: More than three times a week    Frequency of Social Gatherings with Friends and Family: Three times a week    Attends Religious Services: More than 4 times per year    Active Member of Clubs or Organizations: No    Attends Banker Meetings: Never    Marital Status: Married    Tobacco Counseling Counseling given: Not Answered   Clinical Intake:  Pre-visit preparation completed: Yes  Pain : No/denies pain     BMI - recorded: 28.43 Nutritional Status: BMI 25 -29 Overweight Nutritional Risks: None Diabetes: No  How often do you need to have someone help you when you read instructions, pamphlets, or other written materials from your doctor or pharmacy?: 1 - Never  Interpreter Needed?: No  Information entered by :: Rishard Delange, RMA   Activities of Daily Living    04/28/2023    3:48 PM  In your present state of health, do you have any difficulty performing the following activities:  Hearing? 0  Vision? 0  Difficulty concentrating or making decisions? 0  Walking or climbing stairs? 0  Dressing or bathing? 0  Doing errands, shopping? 0  Preparing Food and eating ? N  Using the Toilet? N  In the past six months, have you accidently leaked urine? Y  Do you have problems with loss of bowel control? N  Managing your Medications? N  Managing your Finances? N   Housekeeping or managing your Housekeeping? N    Patient Care Team: Pincus Sanes, MD as PCP - General (Internal Medicine) [facility], [place]  Indicate any recent Medical Services you may have received from other than [facility] providers in the past year (date may be approximate).     Assessment:   This is a routine wellness examination for [name].  Hearing/Vision screen Hearing Screening - Comments:: Denies hearing difficulties   Vision Screening - Comments:: Wears eyeglasses   Goals Addressed               This Visit's Progress     Patient Stated (pt-stated)        Stay healthy.  Depression Screen    04/28/2023    3:55 PM 11/24/2022    9:45 AM 04/25/2022    9:33 AM 04/23/2021    9:29 AM 07/22/2020    8:58 AM 07/22/2019    8:50 AM 07/17/2017    1:56 PM  PHQ 2/9 Scores  PHQ - 2 Score 0 0 0 0 0 0 0  PHQ- 9 Score 1          Fall Risk    04/28/2023    3:51 PM 11/24/2022    9:45 AM 04/25/2022    9:33 AM 04/23/2021    9:25 AM 07/22/2020    8:58 AM  Fall Risk   Falls in the past year? 0 0 0 0 1  Number falls in past yr: 0 0 0 0 0  Injury with Fall? 0 0 0 0 0  Risk for fall due to : No Fall Risks No Fall Risks No Fall Risks No Fall Risks No Fall Risks  Follow up Falls prevention discussed;Falls evaluation completed Falls evaluation completed Falls evaluation completed Falls evaluation completed Falls evaluation completed    MEDICARE RISK AT HOME: Medicare Risk at Home Any stairs in or around the home?: Yes (5 steps to get in house.) If so, are there any without handrails?: Yes Home free of loose throw rugs in walkways, pet beds, electrical cords, etc?: Yes Adequate lighting in your home to reduce risk of falls?: Yes Life alert?: No Use of a cane, walker or w/c?: Yes Grab bars in the bathroom?: Yes Shower chair or bench in shower?: Yes Elevated toilet seat or a handicapped toilet?: Yes  TIMED UP AND GO:  Was the test performed?  No    Cognitive  Function:        04/28/2023    3:52 PM 04/25/2022    9:40 AM  6CIT Screen  What Year? 0 points 0 points  What month? 0 points 0 points  What time? 0 points 0 points  Count back from 20 4 points 0 points  Months in reverse 0 points 0 points  Repeat phrase 0 points 2 points  Total Score 4 points 2 points    Immunizations Immunization History  Administered Date(s) Administered   Fluad Quad(high Dose 65+) 04/02/2019, 04/06/2021   Influenza, High Dose Seasonal PF 04/08/2020   Influenza-Unspecified 05/11/2015, 04/01/2016, 04/19/2016, 04/10/2017   Moderna Sars-Covid-2 Vaccination 08/31/2019, 09/30/2019, 05/30/2020   Pneumococcal Conjugate-13 06/02/2015   Pneumococcal Polysaccharide-23 07/14/2016   Tdap 07/14/2016   Zoster Recombinant(Shingrix) 07/16/2021    TDAP status: Up to date  Flu Vaccine status: Due, Education has been provided regarding the importance of this vaccine. Advised may receive this vaccine at local pharmacy or Health Dept. Aware to provide a copy of the vaccination record if obtained from local pharmacy or Health Dept. Verbalized acceptance and understanding.  Pneumococcal vaccine status: Up to date  Covid-19 vaccine status: Information provided on how to obtain vaccines.   Qualifies for Shingles Vaccine? Yes   Zostavax completed Yes   Shingrix Completed?: Yes  Screening Tests Health Maintenance  Topic Date Due   DEXA SCAN  07/18/2021   INFLUENZA VACCINE  03/02/2023   COVID-19 Vaccine (4 - 2023-24 season) 04/02/2023   Medicare Annual Wellness (AWV)  04/27/2024   DTaP/Tdap/Td (2 - Td or Tdap) 07/14/2026   Pneumonia Vaccine 66+ Years old  Completed   Hepatitis C Screening  Completed   HPV VACCINES  Aged Out   Zoster Vaccines- Shingrix  Discontinued  Health Maintenance  Health Maintenance Due  Topic Date Due   DEXA SCAN  07/18/2021   INFLUENZA VACCINE  03/02/2023   COVID-19 Vaccine (4 - 2023-24 season) 04/02/2023    Colorectal cancer screening:  No longer required.   Mammogram status: No longer required due to age.  Bone Density status: Ordered 05/25/2023. Pt provided with contact info and advised to call to schedule appt.  Lung Cancer Screening: (Low Dose CT Chest recommended if Age 16-80 years, 20 pack-year currently smoking OR have quit w/in 15years.) does not qualify.   Lung Cancer Screening Referral: N/A  Additional Screening:  Hepatitis C Screening: does qualify; Completed 07/14/2016  Vision Screening: Recommended annual ophthalmology exams for early detection of glaucoma and other disorders of the eye. Is the patient up to date with their annual eye exam?  No  Who is the provider or what is the name of the office in which the patient attends annual eye exams? [facility] If pt is not established with a provider, would they like to be referred to a provider to establish care? No .   Dental Screening: Recommended annual dental exams for proper oral hygiene   Community Resource Referral / Chronic Care Management: CRR required this visit?  No   CCM required this visit?  No     Plan:     I have personally reviewed and noted the following in the patient's chart:   Medical and social history Use of alcohol, tobacco or illicit drugs  Current medications and supplements including opioid prescriptions. Patient is not currently taking opioid prescriptions. Functional ability and status Nutritional status Physical activity Advanced directives List of other physicians Hospitalizations, surgeries, and ER visits in previous 12 months Vitals Screenings to include cognitive, depression, and falls Referrals and appointments  In addition, I have reviewed and discussed with patient certain preventive protocols, quality metrics, and best practice recommendations. A written personalized care plan for preventive services as well as general preventive health recommendations were provided to patient.     Isabella Ida L Makala Fetterolf,  CMA   04/28/2023   After Visit Summary: (Mail) Due to this being a telephonic visit, the after visit summary with patients personalized plan was offered to patient via mail   Nurse Notes: Patient is due for for a Flu vaccine.  She is scheduled for a DEXA on May 25, 2023.  Patient had no other concerns to address today.

## 2023-04-28 NOTE — Telephone Encounter (Signed)
Message left for Marchelle Folks to return call to clinic.  If she calls back please verify who she is (is she with home health, etc)  She can either fax FL2 form over to office or we can complete but will need number and fax number to facility where form needs to go along with contact per (if not [place]) and fax number.  Please obtain information above and send back to me.  Thanks

## 2023-04-28 NOTE — Patient Instructions (Signed)
Ms. Sindt , Thank you for taking time to come for your Medicare Wellness Visit. I appreciate your ongoing commitment to your health goals. Please review the following plan we discussed and let me know if I can assist you in the future.   Referrals/Orders/Follow-Ups/Clinician Recommendations: You are due for a Flu vaccine.  Each day, aim for 6 glasses of water, plenty of protein in your diet and try to get up and walk/ stretch every hour for 5-10 minutes at a time.    This is a list of the screening recommended for you and due dates:  Health Maintenance  Topic Date Due   DEXA scan (bone density measurement)  07/18/2021   Flu Shot  03/02/2023   COVID-19 Vaccine (4 - 2023-24 season) 04/02/2023   Medicare Annual Wellness Visit  04/27/2024   DTaP/Tdap/Td vaccine (2 - Td or Tdap) 07/14/2026   Pneumonia Vaccine  Completed   Hepatitis C Screening  Completed   HPV Vaccine  Aged Out   Zoster (Shingles) Vaccine  Discontinued    Advanced directives: (Copy Requested) Please bring a copy of your health care power of attorney and living will to the office to be added to your chart at your convenience.  Next Medicare Annual Wellness Visit scheduled for next year: Yes

## 2023-04-28 NOTE — Telephone Encounter (Signed)
[name] is going to drop off form if she can not fax.

## 2023-05-25 ENCOUNTER — Inpatient Hospital Stay: Admission: RE | Admit: 2023-05-25 | Payer: Medicare HMO | Source: Ambulatory Visit

## 2023-05-26 ENCOUNTER — Other Ambulatory Visit (HOSPITAL_COMMUNITY): Payer: Medicare HMO

## 2023-05-26 ENCOUNTER — Ambulatory Visit (INDEPENDENT_AMBULATORY_CARE_PROVIDER_SITE_OTHER)
Admission: RE | Admit: 2023-05-26 | Discharge: 2023-05-26 | Disposition: A | Discharge status: Home / Self Care | Payer: Medicare HMO | Source: Ambulatory Visit | Attending: Internal Medicine | Admitting: Internal Medicine

## 2023-05-26 DIAGNOSIS — M85859 Other specified disorders of bone density and structure, unspecified thigh: Secondary | ICD-10-CM

## 2023-05-26 DIAGNOSIS — E2839 Other primary ovarian failure: Secondary | ICD-10-CM

## 2023-07-12 DIAGNOSIS — H524 Presbyopia: Secondary | ICD-10-CM | POA: Diagnosis not present

## 2023-07-13 DIAGNOSIS — Z0100 Encounter for examination of eyes and vision without abnormal findings: Secondary | ICD-10-CM | POA: Diagnosis not present

## 2023-11-29 ENCOUNTER — Encounter: Payer: Self-pay | Admitting: Internal Medicine

## 2023-11-29 NOTE — Patient Instructions (Addendum)
 Blood work was ordered.       Medications changes include :   None     Return in about 1 year (around 11/29/2024) for Physical Exam.   Health Maintenance, Female Adopting a healthy lifestyle and getting preventive care are important in promoting health and wellness. Ask your health care provider about: The right schedule for you to have regular tests and exams. Things you can do on your own to prevent diseases and keep yourself healthy. What should I know about diet, weight, and exercise? Eat a healthy diet  Eat a diet that includes plenty of vegetables, fruits, low-fat dairy products, and lean protein. Do not eat a lot of foods that are high in solid fats, added sugars, or sodium. Maintain a healthy weight Body mass index (BMI) is used to identify weight problems. It estimates body fat based on height and weight. Your health care provider can help determine your BMI and help you achieve or maintain a healthy weight. Get regular exercise Get regular exercise. This is one of the most important things you can do for your health. Most adults should: Exercise for at least 150 minutes each week. The exercise should increase your heart rate and make you sweat (moderate-intensity exercise). Do strengthening exercises at least twice a week. This is in addition to the moderate-intensity exercise. Spend less time sitting. Even light physical activity can be beneficial. Watch cholesterol and blood lipids Have your blood tested for lipids and cholesterol at 77 years of age, then have this test every 5 years. Have your cholesterol levels checked more often if: Your lipid or cholesterol levels are high. You are older than 77 years of age. You are at high risk for heart disease. What should I know about cancer screening? Depending on your health history and family history, you may need to have cancer screening at various ages. This may include screening for: Breast cancer. Cervical  cancer. Colorectal cancer. Skin cancer. Lung cancer. What should I know about heart disease, diabetes, and high blood pressure? Blood pressure and heart disease High blood pressure causes heart disease and increases the risk of stroke. This is more likely to develop in people who have high blood pressure readings or are overweight. Have your blood pressure checked: Every 3-5 years if you are 59-68 years of age. Every year if you are 36 years old or older. Diabetes Have regular diabetes screenings. This checks your fasting blood sugar level. Have the screening done: Once every three years after age 51 if you are at a normal weight and have a low risk for diabetes. More often and at a younger age if you are overweight or have a high risk for diabetes. What should I know about preventing infection? Hepatitis B If you have a higher risk for hepatitis B, you should be screened for this virus. Talk with your health care provider to find out if you are at risk for hepatitis B infection. Hepatitis C Testing is recommended for: Everyone born from [birth year] through 1964. Anyone with known risk factors for hepatitis C. Sexually transmitted infections (STIs) Get screened for STIs, including gonorrhea and chlamydia, if: You are sexually active and are younger than 77 years of age. You are older than 77 years of age and your health care provider tells you that you are at risk for this type of infection. Your sexual activity has changed since you were last screened, and you are at increased risk for chlamydia or gonorrhea.  Ask your health care provider if you are at risk. Ask your health care provider about whether you are at high risk for HIV. Your health care provider may recommend a prescription medicine to help prevent HIV infection. If you choose to take medicine to prevent HIV, you should first get tested for HIV. You should then be tested every 3 months for as long as you are taking the  medicine. Pregnancy If you are about to stop having your period (premenopausal) and you may become pregnant, seek counseling before you get pregnant. Take 400 to 800 micrograms (mcg) of folic acid every day if you become pregnant. Ask for birth control (contraception) if you want to prevent pregnancy. Osteoporosis and menopause Osteoporosis is a disease in which the bones lose minerals and strength with aging. This can result in bone fractures. If you are 59 years old or older, or if you are at risk for osteoporosis and fractures, ask your health care provider if you should: Be screened for bone loss. Take a calcium or vitamin D  supplement to lower your risk of fractures. Be given hormone replacement therapy (HRT) to treat symptoms of menopause. Follow these instructions at home: Alcohol use Do not drink alcohol if: Your health care provider tells you not to drink. You are pregnant, may be pregnant, or are planning to become pregnant. If you drink alcohol: Limit how much you have to: 0-1 drink a day. Know how much alcohol is in your drink. In the [place] one drink equals one 12 oz bottle of beer (355 mL), one 5 oz glass of wine (148 mL), or one 1 oz glass of hard liquor (44 mL). Lifestyle Do not use any products that contain nicotine or tobacco. These products include cigarettes, chewing tobacco, and vaping devices, such as e-cigarettes. If you need help quitting, ask your health care provider. Do not use street drugs. Do not share needles. Ask your health care provider for help if you need support or information about quitting drugs. General instructions Schedule regular health, dental, and eye exams. Stay current with your vaccines. Tell your health care provider if: You often feel depressed. You have ever been abused or do not feel safe at home. Summary Adopting a healthy lifestyle and getting preventive care are important in promoting health and wellness. Follow your health care  provider's instructions about healthy diet, exercising, and getting tested or screened for diseases. Follow your health care provider's instructions on monitoring your cholesterol and blood pressure. This information is not intended to replace advice given to you by your health care provider. Make sure you discuss any questions you have with your health care provider. Document Revised: 12/07/2020 Document Reviewed: 12/07/2020 Elsevier Patient Education  2024 [employer].

## 2023-11-29 NOTE — Progress Notes (Signed)
 Subjective:    Patient ID: [name], female    DOB: [birth year], 78 y.o.   MRN: [id number]      HPI [name] is here for a Physical exam and her chronic medical problems.   Doing well overall.  No concerns.  Her husband died last fall.  She denies depression, anxiety.   She is eating ok.    Medications and allergies reviewed with patient and updated if appropriate.  Current Outpatient Medications on File Prior to Visit  Medication Sig Dispense Refill   Bioflavonoid Products (BIOFLEX PO) Take 1 capsule by mouth daily.      cholecalciferol (VITAMIN D ) 1000 units tablet Take 1 tablet (1,000 Units total) by mouth daily.     loratadine (CLARITIN) 10 MG tablet Take 10 mg by mouth daily.     No current facility-administered medications on file prior to visit.    Review of Systems  Constitutional:  Negative for fever.  Eyes:  Negative for visual disturbance.  Respiratory:  Negative for cough, shortness of breath and wheezing.   Cardiovascular:  Negative for chest pain, palpitations and leg swelling.  Gastrointestinal:  Negative for abdominal pain, blood in stool, constipation and diarrhea.       No gerd  Genitourinary:  Negative for dysuria.  Musculoskeletal:  Positive for arthralgias and back pain (if lays on back too long).  Skin:  Negative for rash.  Neurological:  Positive for numbness (left leg - chronic). Negative for dizziness, light-headedness and headaches.  Psychiatric/Behavioral:  Negative for dysphoric mood and sleep disturbance (gets 4-6 hrs - denies fatigue during the day). The patient is not nervous/anxious.        Objective:   Vitals:   11/30/23 0912  BP: 132/70  Pulse: 68  Temp: 98 F (36.7 C)  SpO2: 97%   Filed Weights   11/30/23 0912  Weight: 183 lb (83 kg)   Body mass index is 27.83 kg/m.  BP Readings from Last 3 Encounters:  11/30/23 132/70  11/24/22 134/60  08/23/21 134/72    Wt Readings from Last 3 Encounters:  11/30/23 183  lb (83 kg)  04/28/23 187 lb (84.8 kg)  11/24/22 196 lb (88.9 kg)       Physical Exam Constitutional: She appears well-developed and well-nourished. No distress.  HENT:  Head: Normocephalic and atraumatic.  Right Ear: External ear normal. Normal ear canal and TM Left Ear: External ear normal.  Normal ear canal and TM Mouth/Throat: Oropharynx is clear and moist.  Eyes: Conjunctivae normal.  Neck: Neck supple. No tracheal deviation present. No thyromegaly present.  No carotid bruit  Cardiovascular: Normal rate, regular rhythm and normal heart sounds.   No murmur heard.  No edema. Pulmonary/Chest: Effort normal and breath sounds normal. No respiratory distress. She has no wheezes. She has no rales.  Breast: deferred   Abdominal: Soft. She exhibits no distension. There is no tenderness.  Lymphadenopathy: She has no cervical adenopathy.  Msk: thoracic kyphosis Skin: Skin is warm and dry. She is not diaphoretic.  Psychiatric: She has a normal mood and affect. Her behavior is normal.     Lab Results  Component Value Date   WBC 5.7 11/24/2022   HGB 14.7 11/24/2022   HCT 43.9 11/24/2022   PLT 173.0 11/24/2022   GLUCOSE 87 11/24/2022   CHOL 152 11/24/2022   TRIG 124.0 11/24/2022   HDL 47.10 11/24/2022   LDLDIRECT 97.0 07/17/2017   LDLCALC 80 11/24/2022   ALT 18 11/24/2022  AST 34 11/24/2022   NA 140 11/24/2022   K 4.3 11/24/2022   CL 105 11/24/2022   CREATININE 0.58 11/24/2022   BUN 12 11/24/2022   CO2 27 11/24/2022   TSH 1.23 11/24/2022   HGBA1C 5.4 11/24/2022         Assessment & Plan:   Physical exam: Screening blood work  ordered Exercise  some walking Weight  is ok Substance abuse  none   Reviewed recommended immunizations.   Health Maintenance  Topic Date Due   COVID-19 Vaccine (4 - 2024-25 season) 12/15/2023 (Originally 04/02/2023)   INFLUENZA VACCINE  03/01/2024   Medicare Annual Wellness (AWV)  04/27/2024   DEXA SCAN  05/25/2025   DTaP/Tdap/Td (2  - Td or Tdap) 07/14/2026   Pneumonia Vaccine 39+ Years old  Completed   Hepatitis C Screening  Completed   HPV VACCINES  Aged Out   Meningococcal B Vaccine  Aged Out   Zoster Vaccines- Shingrix  Discontinued          See Problem List for Assessment and Plan of chronic medical problems.

## 2023-11-30 ENCOUNTER — Ambulatory Visit: Payer: Medicare HMO | Admitting: Internal Medicine

## 2023-11-30 VITALS — BP 132/70 | HR 68 | Temp 98.0°F | Ht 68.0 in | Wt 183.0 lb

## 2023-11-30 DIAGNOSIS — R7303 Prediabetes: Secondary | ICD-10-CM

## 2023-11-30 DIAGNOSIS — Z0000 Encounter for general adult medical examination without abnormal findings: Secondary | ICD-10-CM | POA: Diagnosis not present

## 2023-11-30 DIAGNOSIS — M810 Age-related osteoporosis without current pathological fracture: Secondary | ICD-10-CM

## 2023-11-30 DIAGNOSIS — E559 Vitamin D deficiency, unspecified: Secondary | ICD-10-CM

## 2023-11-30 DIAGNOSIS — E781 Pure hyperglyceridemia: Secondary | ICD-10-CM

## 2023-11-30 LAB — COMPREHENSIVE METABOLIC PANEL WITH GFR
ALT: 17 U/L (ref 0–35)
AST: 29 U/L (ref 0–37)
Albumin: 3.8 g/dL (ref 3.5–5.2)
Alkaline Phosphatase: 119 U/L — ABNORMAL HIGH (ref 39–117)
BUN: 13 mg/dL (ref 6–23)
CO2: 27 meq/L (ref 19–32)
Calcium: 9.5 mg/dL (ref 8.4–10.5)
Chloride: 106 meq/L (ref 96–112)
Creatinine, Ser: 0.49 mg/dL (ref 0.40–1.20)
GFR: 91.36 mL/min (ref >60.00)
Glucose, Bld: 89 mg/dL (ref 70–99)
Potassium: 4.3 meq/L (ref 3.5–5.1)
Sodium: 139 meq/L (ref 135–145)
Total Bilirubin: 0.7 mg/dL (ref 0.2–1.2)
Total Protein: 7.6 g/dL (ref 6.0–8.3)

## 2023-11-30 LAB — CBC WITH DIFFERENTIAL/PLATELET
Basophils Absolute: 0 10*3/uL (ref 0.0–0.1)
Basophils Relative: 0.4 % (ref 0.0–3.0)
Eosinophils Absolute: 0 10*3/uL (ref 0.0–0.7)
Eosinophils Relative: 0.8 % (ref 0.0–5.0)
HCT: 42.7 % (ref 36.0–46.0)
Hemoglobin: 14 g/dL (ref 12.0–15.0)
Lymphocytes Relative: 35.8 % (ref 12.0–46.0)
Lymphs Abs: 2.1 10*3/uL (ref 0.7–4.0)
MCHC: 32.8 g/dL (ref 30.0–36.0)
MCV: 91.3 fl (ref 78.0–100.0)
Monocytes Absolute: 0.3 10*3/uL (ref 0.1–1.0)
Monocytes Relative: 5.6 % (ref 3.0–12.0)
Neutro Abs: 3.4 10*3/uL (ref 1.4–7.7)
Neutrophils Relative %: 57.4 % (ref 43.0–77.0)
Platelets: 191 10*3/uL (ref 150.0–400.0)
RBC: 4.67 Mil/uL (ref 3.87–5.11)
RDW: 15.1 % (ref 11.5–15.5)
WBC: 5.9 10*3/uL (ref 4.0–10.5)

## 2023-11-30 LAB — HEMOGLOBIN A1C: Hgb A1c MFr Bld: 5.4 % (ref 4.6–6.5)

## 2023-11-30 LAB — LIPID PANEL
Cholesterol: 187 mg/dL (ref 0–200)
HDL: 51.2 mg/dL (ref >39.00)
LDL Cholesterol: 107 mg/dL — ABNORMAL HIGH (ref 0–99)
NonHDL: 135.92
Total CHOL/HDL Ratio: 4
Triglycerides: 145 mg/dL (ref 0.0–149.0)
VLDL: 29 mg/dL (ref 0.0–40.0)

## 2023-11-30 LAB — TSH: TSH: 1.95 u[IU]/mL (ref 0.35–5.50)

## 2023-11-30 LAB — VITAMIN D 25 HYDROXY (VIT D DEFICIENCY, FRACTURES): VITD: 43.96 ng/mL (ref 30.00–100.00)

## 2023-11-30 NOTE — Assessment & Plan Note (Signed)
 Chronic Taking vitamin D daily Check vitamin D level

## 2023-11-30 NOTE — Assessment & Plan Note (Signed)
Chronic Check lipid panel, CMP, TSH, CBC Continue diet control Regular exercise and healthy diet encouraged

## 2023-11-30 NOTE — Assessment & Plan Note (Addendum)
 Chronic DEXA up-to-date Discussed fall prevention Encouraged as much exercise as possible Taking vitamin D -will check level Advised calcium intake through supplementation/food - 500-600 mg bid Discussed fosamax, reclast and prolia  - encouraged starting medication

## 2023-11-30 NOTE — Assessment & Plan Note (Signed)
 Chronic Lab Results  Component Value Date   HGBA1C 5.4 11/24/2022    Check a1c Low sugar / carb diet Stressed regular exercise

## 2024-05-02 ENCOUNTER — Ambulatory Visit (INDEPENDENT_AMBULATORY_CARE_PROVIDER_SITE_OTHER)

## 2024-05-02 VITALS — Ht 68.0 in | Wt 183.0 lb

## 2024-05-02 DIAGNOSIS — Z0000 Encounter for general adult medical examination without abnormal findings: Secondary | ICD-10-CM | POA: Diagnosis not present

## 2024-05-02 NOTE — Patient Instructions (Addendum)
 [name],  Thank you for taking the time for your Medicare Wellness Visit. I appreciate your continued commitment to your health goals. Please review the care plan we discussed, and feel free to reach out if I can assist you further.  Medicare recommends these wellness visits once per year to help you and your care team stay ahead of potential health issues. These visits are designed to focus on prevention, allowing your provider to concentrate on managing your acute and chronic conditions during your regular appointments.  Please note that Annual Wellness Visits do not include a physical exam. Some assessments may be limited, especially if the visit was conducted virtually. If needed, we may recommend a separate in-person follow-up with your provider.  Ongoing Care Seeing your primary care provider every 3 to 6 months helps us  monitor your health and provide consistent, personalized care.   Referrals If a referral was made during today's visit and you haven't received any updates within two weeks, please contact the referred provider directly to check on the status.  Recommended Screenings:  Health Maintenance  Topic Date Due   Flu Shot  03/01/2024   COVID-19 Vaccine (4 - 2025-26 season) 04/01/2024   Medicare Annual Wellness Visit  05/02/2025   DEXA scan (bone density measurement)  05/25/2025   DTaP/Tdap/Td vaccine (2 - Td or Tdap) 07/14/2026   Pneumococcal Vaccine for age over 66  Completed   Hepatitis C Screening  Completed   HPV Vaccine  Aged Out   Meningitis B Vaccine  Aged Out   Zoster (Shingles) Vaccine  Discontinued       05/02/2024    2:51 PM  Advanced Directives  Does Patient Have a Medical Advance Directive? No  Would patient like information on creating a medical advance directive? No - Patient declined   Advance Care Planning is important because it: Ensures you receive medical care that aligns with your values, goals, and preferences. Provides guidance to your  family and loved ones, reducing the emotional burden of decision-making during critical moments.  Vision: Annual vision screenings are recommended for early detection of glaucoma, cataracts, and diabetic retinopathy. These exams can also reveal signs of chronic conditions such as diabetes and high blood pressure.  Dental: Annual dental screenings help detect early signs of oral cancer, gum disease, and other conditions linked to overall health, including heart disease and diabetes.

## 2024-05-02 NOTE — Progress Notes (Signed)
 Subjective:  Please attest and cosign this visit due to patients primary care provider not being in the office at the time the visit was completed.  (Pt of Dr Glade Hope)  Please attest and cosign this visit due to patients primary care provider not being in the office at the time the visit was completed.  (Pt of Dr Glade Hope)   [name] is a 78 y.o. who presents for a Medicare Wellness preventive visit.  As a reminder, Annual Wellness Visits don't include a physical exam, and some assessments may be limited, especially if this visit is performed virtually. We may recommend an in-person follow-up visit with your provider if needed.  Visit Complete: Virtual I connected with  Wendy Seward Keel on 05/02/24 by a audio enabled telemedicine application and verified that I am speaking with the correct person using two identifiers.  Patient Location: Home  Provider Location: Office/Clinic  I discussed the limitations of evaluation and management by telemedicine. The patient expressed understanding and agreed to proceed.  Vital Signs: Because this visit was a virtual/telehealth visit, some criteria may be missing or patient reported. Any vitals not documented were not able to be obtained and vitals that have been documented are patient reported.  VideoDeclined- This patient declined Librarian, academic. Therefore the visit was completed with audio only.  Persons Participating in Visit: Patient.  AWV Questionnaire: No: Patient Medicare AWV questionnaire was not completed prior to this visit.  Cardiac Risk Factors include: advanced age (>73men, >37 women);dyslipidemia     Objective:    Today's Vitals   05/02/24 1451  Weight: 183 lb (83 kg)  Height: 5' 8 (1.727 m)   Body mass index is 27.83 kg/m.     05/02/2024    2:51 PM 04/28/2023    3:50 PM 04/25/2022    9:33 AM 04/23/2021    9:41 AM 07/09/2015    2:00 AM 07/06/2015    1:04 PM  Advanced  Directives  Does Patient Have a Medical Advance Directive? No Yes Yes Yes No  No   Type of Advance Directive  Living will Living will Living will    Does patient want to make changes to medical advance directive?    No - Patient declined    Copy of Healthcare Power of Attorney in Chart?  No - copy requested      Would patient like information on creating a medical advance directive? No - Patient declined    Yes - Transport planner given  Yes - Transport planner given      Data saved with a previous flowsheet row definition    Current Medications (verified) Outpatient Encounter Medications as of 05/02/2024  Medication Sig   Bioflavonoid Products (BIOFLEX PO) Take 1 capsule by mouth daily.    cholecalciferol (VITAMIN D ) 1000 units tablet Take 1 tablet (1,000 Units total) by mouth daily.   loratadine (CLARITIN) 10 MG tablet Take 10 mg by mouth daily.   No facility-administered encounter medications on file as of 05/02/2024.    Allergies (verified) Patient has no known allergies.   History: Past Medical History:  Diagnosis Date   Arthritis    Hands and elbows   GERD (gastroesophageal reflux disease)    tums prn   Medical history non-contributory    Seasonal allergies    Past Surgical History:  Procedure Laterality Date   KYPHOPLASTY N/A 07/08/2015   Procedure: L1 KYPHOPLASTY;  Surgeon: Donaciano Sprang, MD;  Location: [facility] OR;  Service:  Orthopedics;  Laterality: N/A;   ORIF ANKLE FRACTURE Right 2003   TRANSFORAMINAL LUMBAR INTERBODY FUSION (TLIF) WITH PEDICLE SCREW FIXATION 1 LEVEL N/A 07/08/2015   Procedure: L5 - S1 TRANSFORAMINAL LUMBAR INTERBODY FUSION (TLIF) WITH PEDICLE SCREW FIXATION 1 LEVEL;  Surgeon: Donaciano Sprang, MD;  Location: [facility] OR;  Service: Orthopedics;  Laterality: N/A;   Family History  Problem Relation Age of Onset   Diabetes Mother    Hypertension Mother    Muscular dystrophy Father    Hypotension Father    Diabetes Maternal Grandmother    Social History    Socioeconomic History   Marital status: Married    Spouse name: Tex   Number of children: 2   Years of education: Not on file   Highest education level: Not on file  Occupational History   Occupation: Retired  Tobacco Use   Smoking status: Never   Smokeless tobacco: Never  Vaping Use   Vaping status: Not on file  Substance and Sexual Activity   Alcohol use: No   Drug use: No   Sexual activity: Not Currently  Other Topics Concern   Not on file  Social History Narrative   Lives with husband.  Has an out door cat.   Social Drivers of Corporate investment banker Strain: Low Risk  (05/02/2024)   Overall Financial Resource Strain (CARDIA)    Difficulty of Paying Living Expenses: Not hard at all  Food Insecurity: No Food Insecurity (05/02/2024)   Hunger Vital Sign    Worried About Running Out of Food in the Last Year: Never true    Ran Out of Food in the Last Year: Never true  Transportation Needs: No Transportation Needs (05/02/2024)   PRAPARE - Administrator, Civil Service (Medical): No    Lack of Transportation (Non-Medical): No  Physical Activity: Insufficiently Active (05/02/2024)   Exercise Vital Sign    Days of Exercise per Week: 4 days    Minutes of Exercise per Session: 20 min  Stress: No Stress Concern Present (05/02/2024)   [employer] of Occupational Health - Occupational Stress Questionnaire    Feeling of Stress: Not at all  Social Connections: Socially Integrated (05/02/2024)   Social Connection and Isolation Panel    Frequency of Communication with Friends and Family: More than three times a week    Frequency of Social Gatherings with Friends and Family: Three times a week    Attends Religious Services: More than 4 times per year    Active Member of Clubs or Organizations: Yes    Attends Banker Meetings: Never    Marital Status: Married    Tobacco Counseling Counseling given: Not Answered    Clinical Intake:  Pre-visit  preparation completed: Yes  Pain : No/denies pain     BMI - recorded: 27.83 Nutritional Status: BMI 25 -29 Overweight Nutritional Risks: None Diabetes: No  Lab Results  Component Value Date   HGBA1C 5.4 11/30/2023   HGBA1C 5.4 11/24/2022   HGBA1C 5.5 07/22/2020     How often do you need to have someone help you when you read instructions, pamphlets, or other written materials from your doctor or pharmacy?: 1 - Never  Interpreter Needed?: No  Information entered by :: Verdie Saba, CMA   Activities of Daily Living     05/02/2024    2:53 PM  In your present state of health, do you have any difficulty performing the following activities:  Hearing? 0  Vision? 0  Difficulty concentrating or making decisions? 0  Walking or climbing stairs? 0  Dressing or bathing? 0  Doing errands, shopping? 0  Preparing Food and eating ? N  Using the Toilet? N  In the past six months, have you accidently leaked urine? Y  Comment wears a pad/pantyliner  Do you have problems with loss of bowel control? N  Managing your Medications? N  Managing your Finances? N  Housekeeping or managing your Housekeeping? N    Patient Care Team: Geofm Glade PARAS, MD as PCP - General (Internal Medicine) [facility], [place]  I have updated your Care Teams any recent Medical Services you may have received from other providers in the past year.     Assessment:   This is a routine wellness examination for November.  Hearing/Vision screen Hearing Screening - Comments:: Denies hearing difficulties   Vision Screening - Comments:: Wears rx glasses - up to date with routine eye exams with [facility]   Goals Addressed               This Visit's Progress     Patient Stated (pt-stated)        Patient stated she plans to continue to walk        Depression Screen     05/02/2024    2:54 PM 04/28/2023    3:55 PM 11/24/2022    9:45 AM 04/25/2022    9:33 AM 04/23/2021    9:29 AM 07/22/2020     8:58 AM 07/22/2019    8:50 AM  PHQ 2/9 Scores  PHQ - 2 Score 0 0 0 0 0 0 0  PHQ- 9 Score 0 1         Fall Risk     05/02/2024    2:54 PM 04/28/2023    3:51 PM 11/24/2022    9:45 AM 04/25/2022    9:33 AM 04/23/2021    9:25 AM  Fall Risk   Falls in the past year? 0 0 0 0 0  Number falls in past yr: 0 0 0 0 0  Injury with Fall? 0 0 0 0 0  Risk for fall due to : No Fall Risks;Impaired balance/gait No Fall Risks No Fall Risks No Fall Risks No Fall Risks  Follow up Falls evaluation completed;Falls prevention discussed Falls prevention discussed;Falls evaluation completed Falls evaluation completed Falls evaluation completed  Falls evaluation completed      Data saved with a previous flowsheet row definition    MEDICARE RISK AT HOME:  Medicare Risk at Home Any stairs in or around the home?: No If so, are there any without handrails?: No Home free of loose throw rugs in walkways, pet beds, electrical cords, etc?: Yes Adequate lighting in your home to reduce risk of falls?: Yes Life alert?: No Use of a cane, walker or w/c?: Yes (walker) Grab bars in the bathroom?: No Shower chair or bench in shower?: Yes Elevated toilet seat or a handicapped toilet?: Yes  TIMED UP AND GO:  Was the test performed?  No  Cognitive Function: 6CIT completed        05/02/2024    2:58 PM 04/28/2023    3:52 PM 04/25/2022    9:40 AM  6CIT Screen  What Year? 0 points 0 points 0 points  What month? 0 points 0 points 0 points  What time? 0 points 0 points 0 points  Count back from 20 0 points 4 points 0 points  Months in reverse  0 points 0 points 0 points  Repeat phrase 0 points 0 points 2 points  Total Score 0 points 4 points 2 points    Immunizations Immunization History  Administered Date(s) Administered   Fluad Quad(high Dose 65+) 04/02/2019, 04/06/2021   INFLUENZA, HIGH DOSE SEASONAL PF 04/08/2020   Influenza-Unspecified 05/11/2015, 04/01/2016, 04/19/2016, 04/10/2017   Moderna Sars-Covid-2  Vaccination 08/31/2019, 09/30/2019, 05/30/2020   Pneumococcal Conjugate-13 06/02/2015   Pneumococcal Polysaccharide-23 07/14/2016   Tdap 07/14/2016   Zoster Recombinant(Shingrix) 07/16/2021    Screening Tests Health Maintenance  Topic Date Due   Influenza Vaccine  03/01/2024   COVID-19 Vaccine (4 - 2025-26 season) 04/01/2024   Medicare Annual Wellness (AWV)  05/02/2025   DEXA SCAN  05/25/2025   DTaP/Tdap/Td (2 - Td or Tdap) 07/14/2026   Pneumococcal Vaccine: 50+ Years  Completed   Hepatitis C Screening  Completed   HPV VACCINES  Aged Out   Meningococcal B Vaccine  Aged Out   Zoster Vaccines- Shingrix  Discontinued    Health Maintenance Items Addressed:  05/02/2024  Additional Screening:  Vision Screening: Recommended annual ophthalmology exams for early detection of glaucoma and other disorders of the eye. Is the patient up to date with their annual eye exam?  Yes  Who is the provider or what is the name of the office in which the patient attends annual eye exams? [facility]  Dental Screening: Recommended annual dental exams for proper oral hygiene  Community Resource Referral / Chronic Care Management: CRR required this visit?  No   CCM required this visit?  No   Plan:    I have personally reviewed and noted the following in the patient's chart:   Medical and social history Use of alcohol, tobacco or illicit drugs  Current medications and supplements including opioid prescriptions. Patient is not currently taking opioid prescriptions. Functional ability and status Nutritional status Physical activity Advanced directives List of other physicians Hospitalizations, surgeries, and ER visits in previous 12 months Vitals Screenings to include cognitive, depression, and falls Referrals and appointments  In addition, I have reviewed and discussed with patient certain preventive protocols, quality metrics, and best practice recommendations. A written personalized  care plan for preventive services as well as general preventive health recommendations were provided to patient.   Verdie CHRISTELLA Saba, CMA   05/02/2024   After Visit Summary: (Declined) Due to this being a telephonic visit, with patients personalized plan was offered to patient but patient Declined AVS at this time   Notes: Nothing significant to report at this time.

## 2024-12-03 ENCOUNTER — Encounter: Admitting: Internal Medicine

## 2025-05-06 ENCOUNTER — Ambulatory Visit
